# Patient Record
Sex: Female | Born: 1945 | Race: Black or African American | Hispanic: No | Marital: Married | Smoking: Never smoker
Health system: Southern US, Community
[De-identification: ages and names within clinical notes are randomized; demographics above are authoritative.]

## PROBLEM LIST (undated history)

## (undated) DIAGNOSIS — Z9289 Personal history of other medical treatment: Secondary | ICD-10-CM

## (undated) DIAGNOSIS — C50919 Malignant neoplasm of unspecified site of unspecified female breast: Secondary | ICD-10-CM

## (undated) DIAGNOSIS — R0683 Snoring: Secondary | ICD-10-CM

## (undated) DIAGNOSIS — Z853 Personal history of malignant neoplasm of breast: Secondary | ICD-10-CM

## (undated) DIAGNOSIS — Z124 Encounter for screening for malignant neoplasm of cervix: Secondary | ICD-10-CM

## (undated) DIAGNOSIS — IMO0001 Reserved for inherently not codable concepts without codable children: Secondary | ICD-10-CM

## (undated) DIAGNOSIS — J45909 Unspecified asthma, uncomplicated: Secondary | ICD-10-CM

## (undated) DIAGNOSIS — Z9889 Other specified postprocedural states: Secondary | ICD-10-CM

## (undated) DIAGNOSIS — J302 Other seasonal allergic rhinitis: Secondary | ICD-10-CM

## (undated) DIAGNOSIS — IMO0002 Reserved for concepts with insufficient information to code with codable children: Secondary | ICD-10-CM

## (undated) HISTORY — DX: Unspecified asthma, uncomplicated: J45.909

## (undated) HISTORY — DX: Other seasonal allergic rhinitis: J30.2

## (undated) HISTORY — DX: Other specified postprocedural states: Z98.890

## (undated) HISTORY — PX: EYE SURGERY: SHX253

## (undated) HISTORY — DX: Encounter for screening for malignant neoplasm of cervix: Z12.4

## (undated) HISTORY — DX: Personal history of other medical treatment: Z92.89

## (undated) HISTORY — DX: Personal history of malignant neoplasm of breast: Z85.3

---
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
## 2008-04-10 DIAGNOSIS — Z124 Encounter for screening for malignant neoplasm of cervix: Secondary | ICD-10-CM

## 2008-04-10 DIAGNOSIS — Z9289 Personal history of other medical treatment: Secondary | ICD-10-CM

## 2008-04-10 HISTORY — DX: Personal history of other medical treatment: Z92.89

## 2008-04-10 HISTORY — DX: Encounter for screening for malignant neoplasm of cervix: Z12.4

## 2008-10-09 ENCOUNTER — Other Ambulatory Visit: Admission: RE | Admit: 2008-10-09 | Discharge: 2008-10-09 | Payer: Self-pay | Admitting: Family Medicine

## 2008-10-29 ENCOUNTER — Encounter: Admission: RE | Admit: 2008-10-29 | Discharge: 2008-10-29 | Payer: Self-pay | Admitting: Radiology

## 2008-11-04 ENCOUNTER — Encounter: Admission: RE | Admit: 2008-11-04 | Discharge: 2008-11-04 | Payer: Self-pay | Admitting: Surgery

## 2008-11-06 ENCOUNTER — Encounter (INDEPENDENT_AMBULATORY_CARE_PROVIDER_SITE_OTHER): Payer: Self-pay | Admitting: Surgery

## 2008-11-06 ENCOUNTER — Ambulatory Visit (HOSPITAL_BASED_OUTPATIENT_CLINIC_OR_DEPARTMENT_OTHER): Admission: RE | Admit: 2008-11-06 | Discharge: 2008-11-06 | Payer: Self-pay | Admitting: Surgery

## 2008-11-06 HISTORY — PX: BREAST LUMPECTOMY: SHX2

## 2008-11-11 ENCOUNTER — Ambulatory Visit: Payer: Self-pay | Admitting: Oncology

## 2008-11-18 LAB — CBC WITH DIFFERENTIAL/PLATELET
BASO%: 0.7 % (ref 0.0–2.0)
Basophils Absolute: 0.1 10*3/uL (ref 0.0–0.1)
EOS%: 3.2 % (ref 0.0–7.0)
HGB: 13.6 g/dL (ref 11.6–15.9)
MCH: 30.9 pg (ref 25.1–34.0)
MCHC: 33.7 g/dL (ref 31.5–36.0)
MONO%: 7.6 % (ref 0.0–14.0)
RBC: 4.41 10*6/uL (ref 3.70–5.45)
RDW: 14.3 % (ref 11.2–14.5)
lymph#: 1.7 10*3/uL (ref 0.9–3.3)

## 2008-11-19 LAB — COMPREHENSIVE METABOLIC PANEL
ALT: 15 U/L (ref 0–35)
AST: 17 U/L (ref 0–37)
Albumin: 3.7 g/dL (ref 3.5–5.2)
Alkaline Phosphatase: 79 U/L (ref 39–117)
Calcium: 9.2 mg/dL (ref 8.4–10.5)
Chloride: 107 mEq/L (ref 96–112)
Potassium: 4.3 mEq/L (ref 3.5–5.3)
Sodium: 143 mEq/L (ref 135–145)
Total Protein: 7 g/dL (ref 6.0–8.3)

## 2008-11-23 ENCOUNTER — Encounter: Admission: RE | Admit: 2008-11-23 | Discharge: 2008-11-23 | Payer: Self-pay | Admitting: Oncology

## 2008-12-02 ENCOUNTER — Ambulatory Visit (HOSPITAL_COMMUNITY): Admission: RE | Admit: 2008-12-02 | Discharge: 2008-12-02 | Payer: Self-pay | Admitting: Oncology

## 2008-12-17 ENCOUNTER — Ambulatory Visit: Payer: Self-pay | Admitting: Oncology

## 2009-01-08 ENCOUNTER — Ambulatory Visit: Admission: RE | Admit: 2009-01-08 | Discharge: 2009-03-25 | Payer: Self-pay | Admitting: Radiation Oncology

## 2009-03-02 ENCOUNTER — Ambulatory Visit: Payer: Self-pay | Admitting: Oncology

## 2009-04-07 ENCOUNTER — Ambulatory Visit: Payer: Self-pay | Admitting: Oncology

## 2009-06-22 ENCOUNTER — Ambulatory Visit: Payer: Self-pay | Admitting: Oncology

## 2009-07-16 LAB — CBC WITH DIFFERENTIAL/PLATELET
Eosinophils Absolute: 0.2 10*3/uL (ref 0.0–0.5)
MONO#: 0.6 10*3/uL (ref 0.1–0.9)
NEUT#: 3.7 10*3/uL (ref 1.5–6.5)
Platelets: 215 10*3/uL (ref 145–400)
RBC: 4.23 10*6/uL (ref 3.70–5.45)
RDW: 13.9 % (ref 11.2–14.5)
WBC: 6.5 10*3/uL (ref 3.9–10.3)

## 2009-07-22 ENCOUNTER — Ambulatory Visit: Admission: RE | Admit: 2009-07-22 | Discharge: 2009-07-27 | Payer: Self-pay | Admitting: Radiation Oncology

## 2009-10-19 ENCOUNTER — Ambulatory Visit: Payer: Self-pay | Admitting: Oncology

## 2009-10-19 LAB — CBC WITH DIFFERENTIAL/PLATELET
Basophils Absolute: 0 10*3/uL (ref 0.0–0.1)
Eosinophils Absolute: 0.2 10*3/uL (ref 0.0–0.5)
HCT: 38.3 % (ref 34.8–46.6)
HGB: 13.1 g/dL (ref 11.6–15.9)
MCV: 92.3 fL (ref 79.5–101.0)
MONO%: 8.2 % (ref 0.0–14.0)
NEUT#: 3.8 10*3/uL (ref 1.5–6.5)
NEUT%: 63.1 % (ref 38.4–76.8)
Platelets: 223 10*3/uL (ref 145–400)
RDW: 14.4 % (ref 11.2–14.5)

## 2009-10-20 LAB — COMPREHENSIVE METABOLIC PANEL
Albumin: 3.7 g/dL (ref 3.5–5.2)
Alkaline Phosphatase: 72 U/L (ref 39–117)
BUN: 13 mg/dL (ref 6–23)
Calcium: 9.2 mg/dL (ref 8.4–10.5)
Glucose, Bld: 115 mg/dL — ABNORMAL HIGH (ref 70–99)
Potassium: 4.3 mEq/L (ref 3.5–5.3)

## 2010-03-17 IMAGING — CR DG CHEST 2V
2 series · 2 of 2 positions shown · non-contrast
Comparison: None

CLINICAL DATA: Preoperative respiratory exam.  Breast cancer.

CHEST - 2 VIEW

[w chest pa]
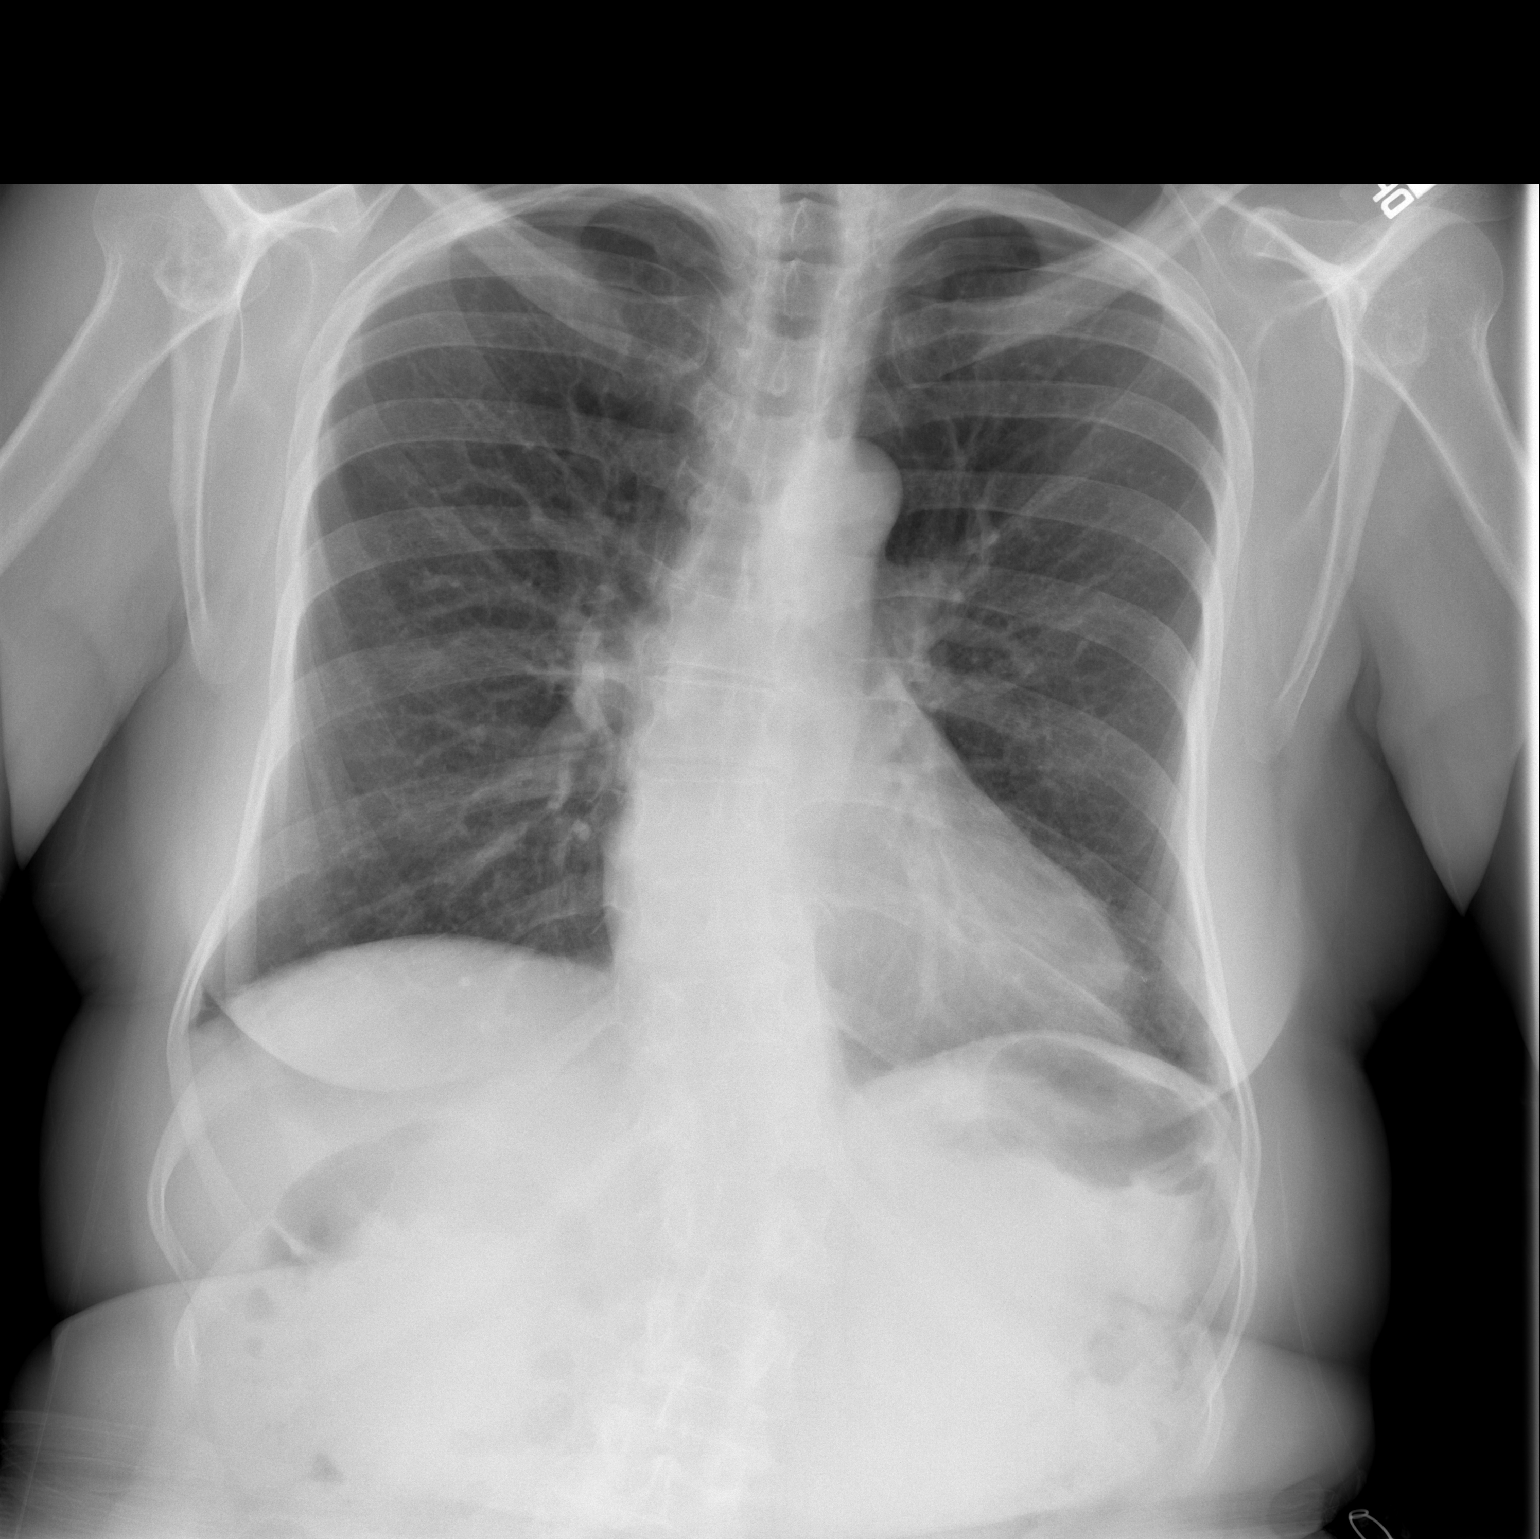

[w chest lat]
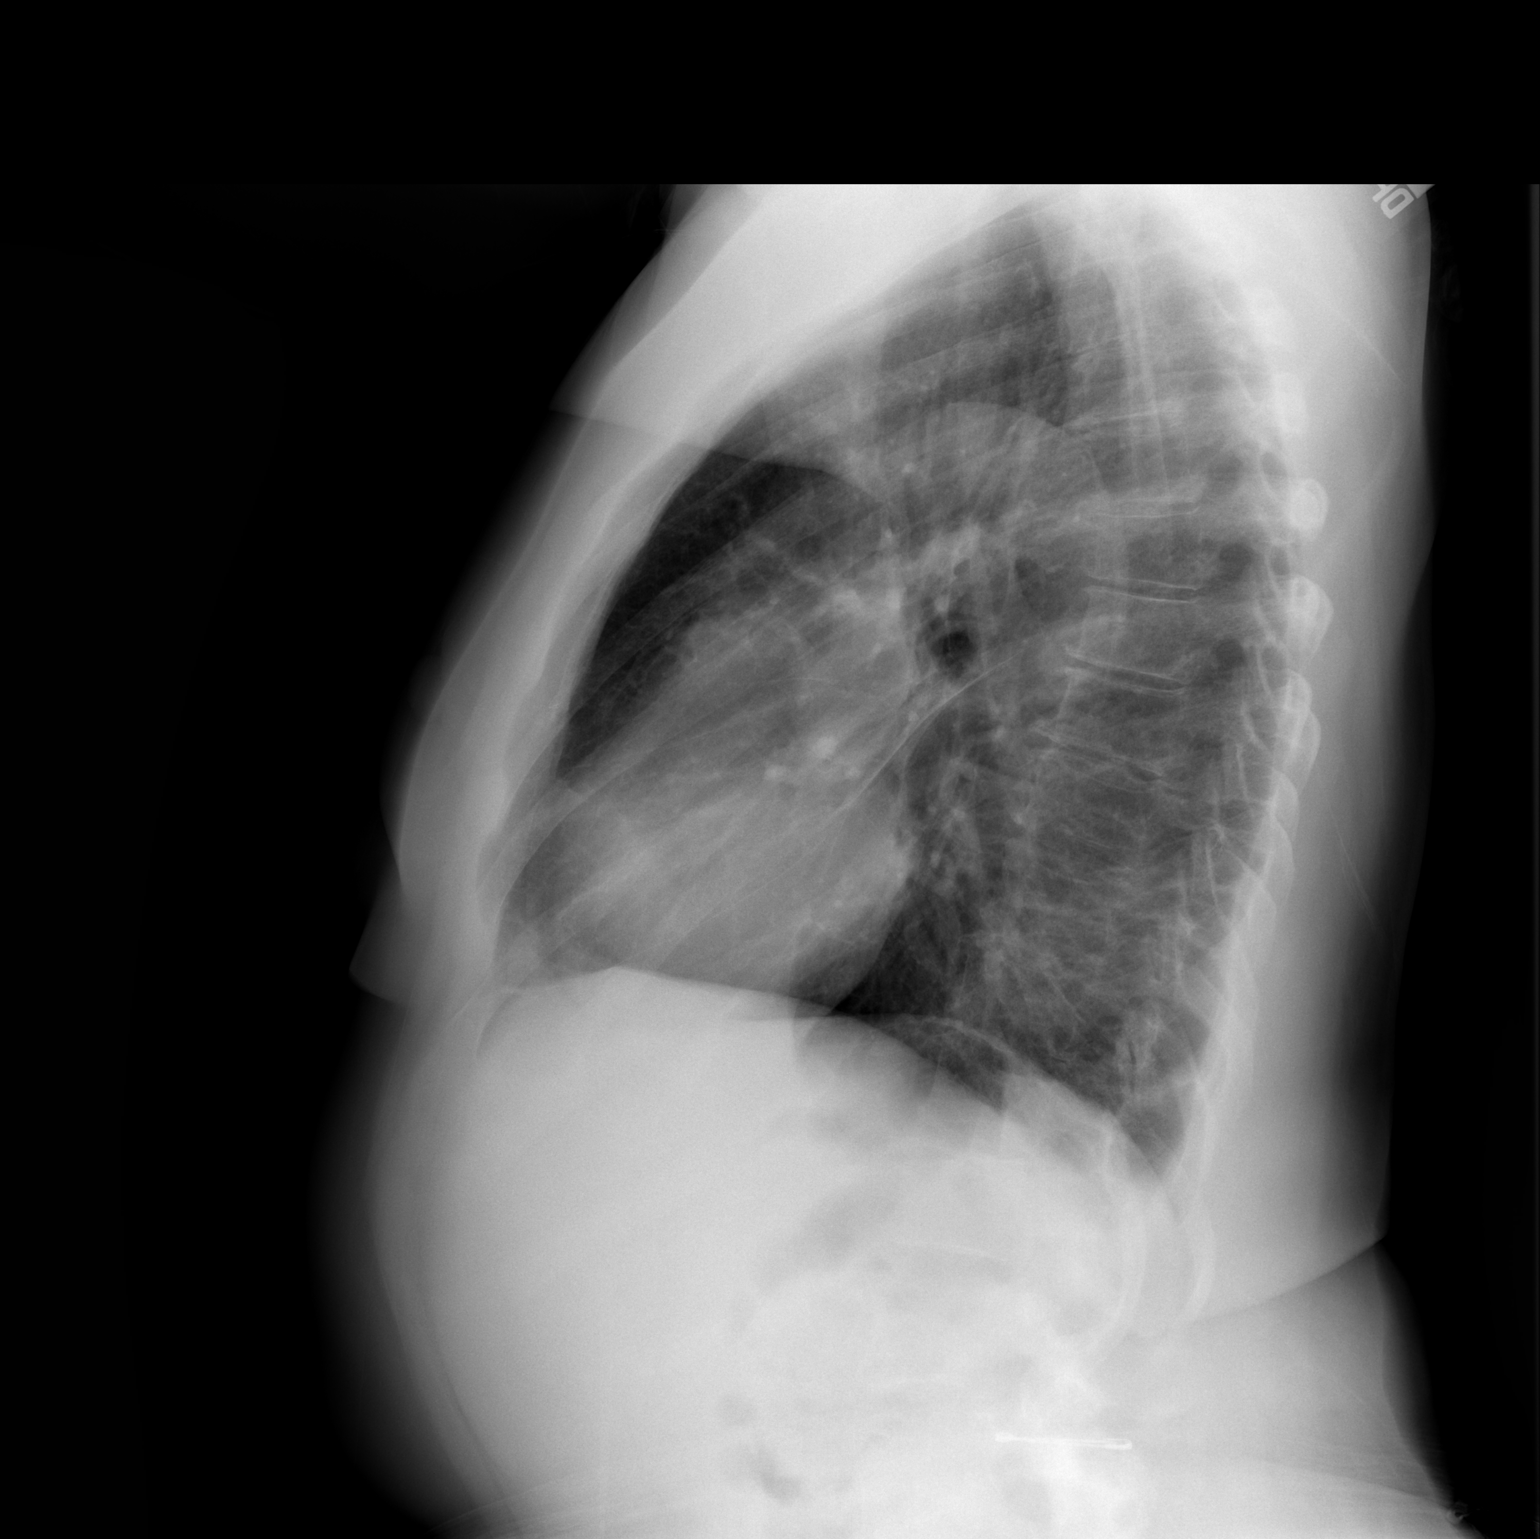

[2 of 2 positions shown; findings below may reference images not displayed]

FINDINGS: Heart size is normal.  Mediastinum is unremarkable except
for scoliosis.  The lungs are clear.  No effusions.  No acute bony
finding.
IMPRESSION: No active disease.  Scoliosis.

## 2010-07-17 LAB — CBC
HCT: 40.6 % (ref 36.0–46.0)
Hemoglobin: 13.8 g/dL (ref 12.0–15.0)
WBC: 7.2 10*3/uL (ref 4.0–10.5)

## 2010-07-17 LAB — URINALYSIS, ROUTINE W REFLEX MICROSCOPIC
Glucose, UA: NEGATIVE mg/dL
Specific Gravity, Urine: 1.009 (ref 1.005–1.030)
pH: 6 (ref 5.0–8.0)

## 2010-07-17 LAB — COMPREHENSIVE METABOLIC PANEL
ALT: 20 U/L (ref 0–35)
Albumin: 3.3 g/dL — ABNORMAL LOW (ref 3.5–5.2)
Alkaline Phosphatase: 77 U/L (ref 39–117)
BUN: 23 mg/dL (ref 6–23)
Chloride: 107 mEq/L (ref 96–112)
Glucose, Bld: 85 mg/dL (ref 70–99)
Potassium: 3.9 mEq/L (ref 3.5–5.1)
Total Bilirubin: 0.8 mg/dL (ref 0.3–1.2)

## 2010-07-17 LAB — DIFFERENTIAL
Basophils Absolute: 0 10*3/uL (ref 0.0–0.1)
Basophils Relative: 1 % (ref 0–1)
Eosinophils Absolute: 0.2 10*3/uL (ref 0.0–0.7)
Monocytes Absolute: 0.5 10*3/uL (ref 0.1–1.0)
Neutro Abs: 4.9 10*3/uL (ref 1.7–7.7)
Neutrophils Relative %: 67 % (ref 43–77)

## 2010-08-23 NOTE — Op Note (Signed)
Ana Woodward, Ana Woodward               ACCOUNT NO.:  [id number]   MEDICAL RECORD NO.:  [id number]          PATIENT TYPE:  AMB   LOCATION:  DSC                          FACILITY:  [facility]   PHYSICIAN:  Currie Paris, M.D.DATE OF BIRTH:  May 31, 1945   DATE OF PROCEDURE:  11/06/2008  DATE OF DISCHARGE:                               OPERATIVE REPORT   PREOPERATIVE DIAGNOSIS:  Carcinoma, left breast upper outer quadrant,  clinical stage II.   POSTOPERATIVE DIAGNOSIS:  Carcinoma, left breast upper outer quadrant,  clinical stage II.   PROCEDURE:  Left partial mastectomy with blue dye injection and sentinel  lymph node biopsy (one node).   SURGEON:  Currie Paris, M.D.   ANESTHESIA:  General.   CLINICAL HISTORY:  This is a 65 year old lady who was recently diagnosed  with a left breast cancer.  She had initially palpated that and after  workup, it was recommended to have a partial mastectomy.  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  We discussed the possibility of needing to take the subareolar  area out as part of her lumpectomy.  We discussed that we might be able  to get a satisfactory frozen exam of that area.  In addition, we planned  a sentinel node.   DESCRIPTION OF PROCEDURE:  The patient was seen in the holding area and  she had no further questions.  We confirmed the plans as noted above.  She had already been injected in the left breast with her radioactive  isotope.   The patient was taken to the operating room and after satisfactory  general anesthesia had been obtained, the time-out was done.  I then  injected the left nipple areolar area with about 5 mL of dilute  methylene blue and this was massaged in.   Routine prep and drape was then done.  I used a Neoprobe and identified  a hot area in the axilla, made a short incision and found  almost  immediately a blue lymphatic leading to a bright blue lymph node that  was saturated with methylene blue.  This was removed and had counts of  about 5000.   Once this was out, I looked for other lymph nodes and could not palpate  any.  I did not see any other blue lymph nodes and I saw no counts of  any significance in the axilla.  I placed a small moist pack here.   I then turned my attention to the breast.  The mass was just above the 3  o'clock position, perhaps about the 2:30 position and just adjacent to  the areolar margin.  I made a curvilinear incision around the areola and  then extended it in a radial fashion directly over the mass.  I raised  some skin flaps superiorly, a little bit laterally, inferiorly and then  medially going beyond the nipple and staying right under the nipple so,  that I got all of the ductal tissue right up  to the base of the nipple  as part of the superficial margin.   Then on the inferior aspect, I went down to the chest wall and then came  around laterally at the level of chest wall dividing the breast tissue  down to the chest wall, then superiorly.  When it was freed up on these  three sides, I was able to elevate it and free it up off the muscle  taking the fascia.   Once this was done, I was able to put traction on the whole area and  divided it medially, basically medial to the nipple.   At this point, I stopped and put some local in to help with postop  analgesia and made sure everything was dry using cautery or suture  ligatures.  I then inked the specimen.  I did do a specimen mammogram to  see that the clip, what appeared to be all the calcifications were in  the specimen, or at least there were no calcifications that appeared to  be near any of the four margins that I could evaluate on the specimen  mammogram.   This was sent and I asked Dr. Colonel Bald to try to do a frozen of the area of  the methylene blue where I put a suture  marker to see if that area was  negative.   Dr. Colonel Bald also reported at this point that the sentinel lymph node was  negative.   That incision was then closed with a 3-0 Vicryl and some 4-Monocryl  subcuticular and at the end of the case Dermabond.   While awaiting for the frozen section of the subareolar tissue, I freed  up the breast off of the chest wall for the length of the lumpectomy  both, superiorly and inferiorly and then approximated the deeper breast  tissue to cover the muscle.  There really was not much more  superficially to close other than the subcu which was closed with some  more 3-0 Vicryl, producing just a little bit of a elevation of the skin  line and the skin was closed from the areolar margin laterally with a  running 4-0 Monocryl subcuticular.   At this point, Dr. Colonel Bald called to say that the tissue was negative in  terms of any cancer at least visible on the frozen section at that one  margin that I was concerned with so, I went ahead and then closed the  curvilinear circumareolar portion of the incision with some 4-0 Monocryl  subcuticular and finally Dermabond.   The patient tolerated the procedure well and there were no  complications.  All counts were correct.      Currie Paris, M.D.  Electronically Signed     CJS/MEDQ  D:  11/06/2008  T:  11/07/2008  Job:  [id number]   cc:   Sigmund Hazel, M.D.

## 2010-12-21 ENCOUNTER — Encounter (INDEPENDENT_AMBULATORY_CARE_PROVIDER_SITE_OTHER): Payer: Self-pay | Admitting: Surgery

## 2011-06-08 ENCOUNTER — Encounter (INDEPENDENT_AMBULATORY_CARE_PROVIDER_SITE_OTHER): Payer: Self-pay | Admitting: General Surgery

## 2011-06-08 DIAGNOSIS — C50412 Malignant neoplasm of upper-outer quadrant of left female breast: Secondary | ICD-10-CM | POA: Insufficient documentation

## 2011-06-09 ENCOUNTER — Encounter (INDEPENDENT_AMBULATORY_CARE_PROVIDER_SITE_OTHER): Payer: Self-pay | Admitting: Surgery

## 2011-06-27 ENCOUNTER — Encounter (INDEPENDENT_AMBULATORY_CARE_PROVIDER_SITE_OTHER): Payer: Self-pay | Admitting: Surgery

## 2011-07-26 ENCOUNTER — Encounter (INDEPENDENT_AMBULATORY_CARE_PROVIDER_SITE_OTHER): Payer: Self-pay | Admitting: Surgery

## 2011-07-26 ENCOUNTER — Telehealth (INDEPENDENT_AMBULATORY_CARE_PROVIDER_SITE_OTHER): Payer: Self-pay | Admitting: Surgery

## 2011-07-26 NOTE — Telephone Encounter (Signed)
Spoke with patient, aware we will not be able to schedule her to come in prior to 09/01/11 due to his limited schedule in May. She understands and will follow up at appt.

## 2011-08-28 ENCOUNTER — Encounter (INDEPENDENT_AMBULATORY_CARE_PROVIDER_SITE_OTHER): Payer: Self-pay

## 2011-09-01 ENCOUNTER — Encounter (INDEPENDENT_AMBULATORY_CARE_PROVIDER_SITE_OTHER): Payer: Self-pay | Admitting: Surgery

## 2011-09-06 ENCOUNTER — Encounter (INDEPENDENT_AMBULATORY_CARE_PROVIDER_SITE_OTHER): Payer: Self-pay | Admitting: Surgery

## 2011-10-18 ENCOUNTER — Encounter (INDEPENDENT_AMBULATORY_CARE_PROVIDER_SITE_OTHER): Payer: Self-pay | Admitting: Ophthalmology

## 2011-10-27 ENCOUNTER — Encounter (INDEPENDENT_AMBULATORY_CARE_PROVIDER_SITE_OTHER): Payer: BC Managed Care – PPO | Admitting: Ophthalmology

## 2011-10-27 DIAGNOSIS — H33309 Unspecified retinal break, unspecified eye: Secondary | ICD-10-CM

## 2011-10-27 DIAGNOSIS — H35419 Lattice degeneration of retina, unspecified eye: Secondary | ICD-10-CM

## 2011-10-27 DIAGNOSIS — H43819 Vitreous degeneration, unspecified eye: Secondary | ICD-10-CM

## 2011-11-08 ENCOUNTER — Ambulatory Visit (INDEPENDENT_AMBULATORY_CARE_PROVIDER_SITE_OTHER): Payer: BC Managed Care – PPO | Admitting: Ophthalmology

## 2011-11-08 DIAGNOSIS — H33309 Unspecified retinal break, unspecified eye: Secondary | ICD-10-CM

## 2011-11-14 ENCOUNTER — Encounter (INDEPENDENT_AMBULATORY_CARE_PROVIDER_SITE_OTHER): Payer: Self-pay | Admitting: Surgery

## 2011-11-14 ENCOUNTER — Ambulatory Visit (INDEPENDENT_AMBULATORY_CARE_PROVIDER_SITE_OTHER): Payer: BC Managed Care – PPO | Admitting: Surgery

## 2011-11-14 VITALS — BP 116/78 | HR 84 | Resp 20 | Ht 71.0 in | Wt 257.0 lb

## 2011-11-14 DIAGNOSIS — Z853 Personal history of malignant neoplasm of breast: Secondary | ICD-10-CM

## 2011-11-14 NOTE — Patient Instructions (Addendum)
Continue annual mammograms and annual followup

## 2011-11-14 NOTE — Progress Notes (Signed)
NAME: Ana Woodward       DOB: Feb 23, 1946           DATE: 11/14/2011       MRN: [id number]   Ana Woodward is a 65 y.o.Marland Kitchenfemale who presents for routine followup of her Left breast cancer, invasive ductal, stage I diagnosed in 2010 and treated with Lumpectomy and sentinel node followed by radiation therapy. She is not on antiestrogen therapy. She has no problems or concerns on either side Other than some intermittent discoloration of the right nipple. She did assist the last time I saw her which was in August of 2011. There's been no change.  PFSH: She has had no significant changes since the last visit here.  ROS: There have been no significant changes since the last visit here  EXAM:  VS: BP 116/78  Pulse 84  Resp 20  Ht 5\' 11"  (1.803 m)  Wt 257 lb (116.574 kg)  BMI 35.84 kg/m2   General: The patient is alert, oriented, generally healthy appearing, NAD. Mood and affect are normal.  Breasts:  The left breast seems a little bit smaller than the right. However there is no abnormality on the right side that I can find in the left breast feels normal with no significant thickening at the lumpectomy site and no tenderness. Minimal to no visible radiation changes.  Lymphatics: She has no axillary or supraclavicular adenopathy on either side.  Extremities: Full ROM of the surgical side with no lymphedema noted.  Data Reviewed: Her mammogram this past March was negative  Impression: Doing well, with no evidence of recurrent cancer or new cancer  Plan: Will continue to follow up on an annual basis here.

## 2011-11-17 ENCOUNTER — Ambulatory Visit (INDEPENDENT_AMBULATORY_CARE_PROVIDER_SITE_OTHER): Payer: BC Managed Care – PPO | Admitting: Ophthalmology

## 2011-11-17 DIAGNOSIS — H33309 Unspecified retinal break, unspecified eye: Secondary | ICD-10-CM

## 2012-03-15 ENCOUNTER — Telehealth (INDEPENDENT_AMBULATORY_CARE_PROVIDER_SITE_OTHER): Payer: Self-pay | Admitting: General Surgery

## 2012-03-15 NOTE — Telephone Encounter (Signed)
Message copied by Liliana Cline on Fri Mar 15, 2012 10:14 AM ------      Message from: Zollie Scale      Created: Thu Mar 14, 2012  4:51 PM      Contact: [phone]       Pt found spot on arm waned a soon appt  Could not find any appt

## 2012-03-15 NOTE — Telephone Encounter (Signed)
Called patient to make appt. No answer on both numbers provided and no way to leave message. If patient calls back she was last seen in August 2013 and can be seen in a follow up spot if needed.

## 2012-03-18 ENCOUNTER — Ambulatory Visit (INDEPENDENT_AMBULATORY_CARE_PROVIDER_SITE_OTHER): Payer: BC Managed Care – PPO | Admitting: Ophthalmology

## 2012-03-28 ENCOUNTER — Ambulatory Visit (INDEPENDENT_AMBULATORY_CARE_PROVIDER_SITE_OTHER): Payer: Self-pay | Admitting: Ophthalmology

## 2013-04-23 ENCOUNTER — Ambulatory Visit: Payer: BC Managed Care – PPO

## 2013-04-23 ENCOUNTER — Ambulatory Visit (INDEPENDENT_AMBULATORY_CARE_PROVIDER_SITE_OTHER): Payer: BC Managed Care – PPO

## 2013-04-23 VITALS — BP 126/76 | HR 67 | Resp 18

## 2013-04-23 DIAGNOSIS — M79609 Pain in unspecified limb: Secondary | ICD-10-CM

## 2013-04-23 DIAGNOSIS — S90129A Contusion of unspecified lesser toe(s) without damage to nail, initial encounter: Secondary | ICD-10-CM

## 2013-04-23 DIAGNOSIS — Q828 Other specified congenital malformations of skin: Secondary | ICD-10-CM

## 2013-04-23 DIAGNOSIS — B351 Tinea unguium: Secondary | ICD-10-CM

## 2013-04-23 MED ORDER — TAVABOROLE 5 % EX SOLN: CUTANEOUS | Status: DC

## 2013-04-23 NOTE — Patient Instructions (Signed)
Onychomycosis/Fungal Toenails  WHAT IS IT? An infection that lies within the keratin of your nail plate that is caused by a fungus.  WHY ME? Fungal infections affect all ages, sexes, races, and creeds.  There may be many factors that predispose you to a fungal infection such as age, coexisting medical conditions such as diabetes, or an autoimmune disease; stress, medications, fatigue, genetics, etc.  Bottom line: fungus thrives in a warm, moist environment and your shoes offer such a location.  IS IT CONTAGIOUS? Theoretically, yes.  You do not want to share shoes, nail clippers or files with someone who has fungal toenails.  Walking around barefoot in the same room or sleeping in the same bed is unlikely to transfer the organism.  It is important to realize, however, that fungus can spread easily from one nail to the next on the same foot.  HOW DO WE TREAT THIS?  There are several ways to treat this condition.  Treatment may depend on many factors such as age, medications, pregnancy, liver and kidney conditions, etc.  It is best to ask your doctor which options are available to you.  1. No treatment.   Unlike many other medical concerns, you can live with this condition.  However for many people this can be a painful condition and may lead to ingrown toenails or a bacterial infection.  It is recommended that you keep the nails cut short to help reduce the amount of fungal nail. 2. Topical treatment.  These range from herbal remedies to prescription strength nail lacquers.  About 40-50% effective, topicals require twice daily application for approximately 9 to 12 months or until an entirely new nail has grown out.  The most effective topicals are medical grade medications available through physicians offices. 3. Oral antifungal medications.  With an 80-90% cure rate, the most common oral medication requires 3 to 4 months of therapy and stays in your system for a year as the new nail grows out.  Oral  antifungal medications do require blood work to make sure it is a safe drug for you.  A liver function panel will be performed prior to starting the medication and after the first month of treatment.  It is important to have the blood work performed to avoid any harmful side effects.  In general, this medication safe but blood work is required. 4. Laser Therapy.  This treatment is performed by applying a specialized laser to the affected nail plate.  This therapy is noninvasive, fast, and non-painful.  It is not covered by insurance and is therefore, out of pocket.  The results have been very good with a 80-95% cure rate.  [facility] is the only practice in the area to offer this therapy. 5. Permanent Nail Avulsion.  Removing the entire nail so that a new nail will not grow back.  Want to obtain your prescription topical Kerydin. Applied to the affected nails once daily as instructed for 12 months duration. Maintain trimming and filing the nails on a monthly basis wash daily with soap and water and reapply the [facility] as instructed

## 2013-04-23 NOTE — Progress Notes (Signed)
   Subjective:    Patient ID: Ana Woodward, female    DOB: [birth year], 68 y.o.   MRN: [id number]  HPImy 3rd toe on left foot has been going since the summer and stumbed it twice and burns and throbs and cant walk on it and my have a callus on the tip of it    Review of Systems  Constitutional: Negative.   HENT:       Sneezing   Eyes: Negative.   Respiratory: Negative.   Cardiovascular: Negative.   Endocrine: Positive for cold intolerance.  Genitourinary: Negative.   Musculoskeletal:       Difficulty walking   Skin: Positive for rash.       Open sores and toenails are fungus       Objective:   Physical Exam Masker status is intact as follows DP +2/4 bilateral PT plus one over 4 bilateral. Capillary fill time 3 seconds all digits skin temperature warm turgor normal mild varicosities noted no edema rubor pallor noted. Neurologically epicritic and proprioceptive sensations intact and symmetric bilateral there is normal plantar response DTRs not elicited. Dermatologically skin color pigment normal hair growth absent nails thick brittle discolored friable with arthrosis and incurvation one through 5 bilateral. Patient has been doing self nail care however is having difficult time to the thickness and discoloration. His interest in fungal nail care topicals at this time. Patient also third digit bilateral left more so than right has a hemorrhage a keratotic lesion consistent with porokeratosis or distal clavus. Examination reveals patient has some digital contractures and dislocation of second MTP area left foot has undergone previous hammertoe repair with IP joint fusion. At this time the patient does have keratoses distal tuft third and exam and evaluation were shoes were shoes are 2 short and slip on shoe which is sliding back-and-forth on your foot with toes bumping up into the end of her shoe. This is correlated to the keratotic lesions secondary to trauma and friction. The painful  keratotic lesion is debrided suggested longer shoes or larger shoe and lace up or strapping systems avoid canning of the shoe x-rays otherwise unremarkable mild bunion deformity bilateral sesamoid position 3 hammertoe deformities with flexibility noted dislocated second MTP on AP and oblique view. Mild degenerative changes and asymmetric joint space narrowing second MTP joint. Contractures of toes 34 and 5 noted bilateral.       Assessment & Plan:  Assessments #1 onychomycosis mycotic nails debrided per patient request is an uncovered service at this time x10. Patient is also dispensed prescription for Kwrtdin topical antifungal to apply daily for 12 months duration.  Patient does have poor keratoses secondary to contusion of toe and hammertoe deformity keratotic lesions. Debridement this time followup in the future with longer more accommodative shoes reappointed future and as-needed basis for further followup and palliative care in the future as needed again ABN form is signed is a noncovered service for debridement of nails and keratoses.  Harriet Masson DPM

## 2013-07-08 ENCOUNTER — Ambulatory Visit: Payer: BC Managed Care – PPO

## 2013-07-28 ENCOUNTER — Other Ambulatory Visit (INDEPENDENT_AMBULATORY_CARE_PROVIDER_SITE_OTHER): Payer: Self-pay | Admitting: Surgery

## 2013-07-28 ENCOUNTER — Telehealth (INDEPENDENT_AMBULATORY_CARE_PROVIDER_SITE_OTHER): Payer: Self-pay | Admitting: *Deleted

## 2013-07-28 DIAGNOSIS — N63 Unspecified lump in unspecified breast: Secondary | ICD-10-CM

## 2013-07-28 NOTE — Telephone Encounter (Signed)
Pt called and she is scheduled for a 3D Diagnostic Mammo and Ultra Sound at [facility] 4.20.15 and needs orders to be put into Epic so they can forward the results to Dr. Ninfa Linden.  Please advise!  Anderson Malta

## 2013-07-28 NOTE — Telephone Encounter (Signed)
This is a Dr. Margot Chimes patient that I have never seen. I don't know who orders her mammagrams yearly, but send to nurses and they can order

## 2013-08-04 ENCOUNTER — Telehealth (INDEPENDENT_AMBULATORY_CARE_PROVIDER_SITE_OTHER): Payer: Self-pay | Admitting: *Deleted

## 2013-08-04 NOTE — Telephone Encounter (Signed)
Pt left a message stating she is due to have annual 3D Mammogram and US done of breast so Dr. Rush Farmer could get the results and she also needs an appt to see.  Per Dr. Rush Farmer, pt is old pt of Dr. Dickie La and he hasn't yet saw the pt.  He asked me to forward this so the test could get ordered and scheduled and then pt needs to be called for her annual f/u.  The reason I sent to Dr. Rush Farmer is b/c that's who the pt said, so I'm assuming maybe that's who Dr. Margot Chimes told her to schedule to see?    Please advise!  Anderson Malta

## 2013-08-04 NOTE — Telephone Encounter (Signed)
Called patient back to let her know that we will do the mammogram and u/s when she comes in to see Dr Ninfa Linden on 08-13-13. And patient agree to that

## 2013-08-13 ENCOUNTER — Ambulatory Visit (INDEPENDENT_AMBULATORY_CARE_PROVIDER_SITE_OTHER): Payer: BC Managed Care – PPO | Admitting: Surgery

## 2013-08-18 ENCOUNTER — Encounter (INDEPENDENT_AMBULATORY_CARE_PROVIDER_SITE_OTHER): Payer: Self-pay | Admitting: Surgery

## 2013-08-18 ENCOUNTER — Ambulatory Visit (INDEPENDENT_AMBULATORY_CARE_PROVIDER_SITE_OTHER): Payer: Commercial Managed Care - HMO | Admitting: Surgery

## 2013-08-18 VITALS — BP 120/84 | HR 78 | Temp 97.1°F | Resp 12 | Ht 71.0 in | Wt 228.0 lb

## 2013-08-18 DIAGNOSIS — Z853 Personal history of malignant neoplasm of breast: Secondary | ICD-10-CM

## 2013-08-18 NOTE — Progress Notes (Signed)
Subjective:     Patient ID: Ana Woodward, female   DOB: [birth year], 68 y.o.   MRN: [id number]  HPI This is a breast cancer patient of Dr. Dickie La.  She is status post a left breast lumpectomy and sentinel node biopsy for stage I invasive cancer. She received radiation therapy. She did not receive anti-hormonal therapy.  She last saw Dr. Margot Chimes in 2013. She just recently had repeat mammograms.  She has no complaints regarding her breast.  Per her report, her mammograms from 2 weeks ago were normal. She received a letter stating such  Review of Systems     Objective:   Physical Exam On exam, there is no axillary adenopathy on either side. There are no palpable breast masses. Lumpectomy site is well-healed.    Assessment:     Patient now 5 years status post treatment for left breast cancer     Plan:     From a surgical standpoint, there is nothing further to offer. She needs her yearly physical examination by her primary care physician and yearly mammograms. We will see her here as needed. We will get a copy of the report of her most recent mammograms

## 2013-10-06 ENCOUNTER — Encounter (INDEPENDENT_AMBULATORY_CARE_PROVIDER_SITE_OTHER): Payer: Commercial Managed Care - HMO | Admitting: Surgery

## 2013-10-14 ENCOUNTER — Encounter (INDEPENDENT_AMBULATORY_CARE_PROVIDER_SITE_OTHER): Payer: Self-pay | Admitting: Surgery

## 2013-10-17 ENCOUNTER — Telehealth: Payer: Self-pay | Admitting: *Deleted

## 2013-10-17 NOTE — Telephone Encounter (Signed)
Can get over-the-counter Fungi-Nail at any drugstore pharmacy, although not as potent as the new prescription medications. It is inexpensive and can be obtained without prescription. Need to applied twice daily to the affected toenails for a minimum of 12 month  Harriet Masson DPM

## 2013-10-17 NOTE — Telephone Encounter (Signed)
Pt called and said because of her age she can not fill keraydin any longer. Age limit is 82 and she is 89. Also she mentioned cost is high. Wants to know if she can have something else?

## 2013-10-20 ENCOUNTER — Telehealth: Payer: Self-pay | Admitting: *Deleted

## 2013-10-20 NOTE — Telephone Encounter (Signed)
I returned her call and informed her Dr. Blenda Mounts said you can try Fungi-Nail over the counter, apply it twice a day for 12 months.  He stated that it may not be quite as effective as the prescription medication.  She stated okay, I hate that because the other seemed to be working well.  She stated I'll see how it goes, at least I know the other option is there.

## 2013-11-03 ENCOUNTER — Encounter (INDEPENDENT_AMBULATORY_CARE_PROVIDER_SITE_OTHER): Payer: Self-pay | Admitting: Surgery

## 2013-11-03 ENCOUNTER — Ambulatory Visit (INDEPENDENT_AMBULATORY_CARE_PROVIDER_SITE_OTHER): Payer: Commercial Managed Care - HMO | Admitting: Surgery

## 2013-11-03 VITALS — BP 125/77 | HR 77 | Temp 98.1°F | Resp 18 | Ht 70.0 in | Wt 231.2 lb

## 2013-11-03 DIAGNOSIS — R229 Localized swelling, mass and lump, unspecified: Secondary | ICD-10-CM

## 2013-11-03 DIAGNOSIS — R222 Localized swelling, mass and lump, trunk: Secondary | ICD-10-CM

## 2013-11-03 DIAGNOSIS — L720 Epidermal cyst: Secondary | ICD-10-CM

## 2013-11-03 DIAGNOSIS — L723 Sebaceous cyst: Secondary | ICD-10-CM

## 2013-11-03 NOTE — Patient Instructions (Signed)
Our schedulers will call you back with setting up surgery

## 2013-11-03 NOTE — Progress Notes (Signed)
Subjective:     Patient ID: Ana Woodward, female   DOB: [birth year], 68 y.o.   MRN: [id number]  HPI This is a patient of ours who we follow for breast cancer. She has had an intermittent draining area on the back of her neck as well as an enlarging area on her left upper back. She treated the area on her neck with peroxide and Neosporin and is improved somewhat but not completely closed. The mass on her back she reports intermittently changes in size and occasionally gets red and tender  Review of Systems     Objective:   Physical Exam On exam, she has what appears to be an infected sebaceous cyst on the back of her neck which is a 7 mm in size. She also has a 5 cm mass on her left upper back which is consistent with either a lipoma or another large sebaceous cyst    Assessment:     7 mm posterior neck mass and 5 cm left upper back mass     Plan:     Because of her symptoms and drainage, removal of both these areas in the operating room is recommended to prevent further infection and for histologic evaluation to rule malignancy. Discussed this with her in detail. I discussed the risk of surgery which includes bleeding, infection, recurrence, et Ronney Asters. She understands and wishes to proceed with surgery.

## 2014-04-10 DIAGNOSIS — Z9889 Other specified postprocedural states: Secondary | ICD-10-CM

## 2014-04-10 HISTORY — DX: Other specified postprocedural states: Z98.890

## 2015-03-23 ENCOUNTER — Other Ambulatory Visit: Payer: Self-pay | Admitting: Radiology

## 2015-03-23 HISTORY — PX: BREAST BIOPSY: SHX20

## 2015-04-07 ENCOUNTER — Other Ambulatory Visit: Payer: Self-pay | Admitting: Surgery

## 2015-04-12 ENCOUNTER — Other Ambulatory Visit: Payer: Self-pay | Admitting: Oncology

## 2015-04-13 ENCOUNTER — Telehealth: Payer: Self-pay | Admitting: Hematology and Oncology

## 2015-04-13 ENCOUNTER — Telehealth: Payer: Self-pay | Admitting: *Deleted

## 2015-04-13 NOTE — Telephone Encounter (Signed)
Mailed new pt packet to pt.  

## 2015-04-13 NOTE — Telephone Encounter (Signed)
new patient appt-s/w patient and gave np appt for 01/10 @ 3:45 w/Dr. Lindi Adie Referring Dr. Ninfa Linden  Referral information scanned

## 2015-04-19 ENCOUNTER — Other Ambulatory Visit: Payer: Self-pay | Admitting: Oncology

## 2015-04-20 ENCOUNTER — Encounter: Payer: Self-pay | Admitting: *Deleted

## 2015-04-20 ENCOUNTER — Ambulatory Visit (HOSPITAL_BASED_OUTPATIENT_CLINIC_OR_DEPARTMENT_OTHER): Payer: Medicare Other | Admitting: Hematology and Oncology

## 2015-04-20 ENCOUNTER — Encounter: Payer: Self-pay | Admitting: Hematology and Oncology

## 2015-04-20 VITALS — BP 141/76 | HR 76 | Temp 98.1°F | Resp 20 | Wt 223.1 lb

## 2015-04-20 DIAGNOSIS — Z800 Family history of malignant neoplasm of digestive organs: Secondary | ICD-10-CM

## 2015-04-20 DIAGNOSIS — C50412 Malignant neoplasm of upper-outer quadrant of left female breast: Secondary | ICD-10-CM

## 2015-04-20 DIAGNOSIS — Z853 Personal history of malignant neoplasm of breast: Secondary | ICD-10-CM | POA: Diagnosis not present

## 2015-04-20 DIAGNOSIS — Z803 Family history of malignant neoplasm of breast: Secondary | ICD-10-CM

## 2015-04-20 NOTE — Progress Notes (Signed)
[facility] CONSULT NOTE  Patient Care Team: Kathyrn Lass, MD as PCP - General (Family Medicine)  CHIEF COMPLAINTS/PURPOSE OF CONSULTATION:  Newly diagnosed breast cancer  HISTORY OF PRESENTING ILLNESS:  Ana Woodward 70 y.o. female is here because of recent diagnosis of left breast cancer. She had a routine screening mammogram which revealed loosely grouped pleomorphic calcifications in the left breast. 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 She undergone radiation followed by surveillance. Recently she had a routine screening mammogram that identified the calcifications. 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 She had seen Dr. Ninfa Linden and is here today to discuss adjuvant treatment options. She was presented to the tumor board.  I reviewed her records extensively and collaborated the history with the patient.  SUMMARY OF ONCOLOGIC HISTORY:   Breast cancer of upper-outer quadrant of left female breast ([facility])   10/22/2008 Initial Diagnosis IDC with High-grade DCIS of the left breast ER/PR and HER-2 negative   11/06/2008 Surgery Left lumpectomy with sentinel node biopsy  invasive ductal carcinoma, grade 3, 0.5 cm. Margins negative. One of one lymph node negative ER/PR 0%, HER-2 negative ratio 0.92, Ki-67 17%, T1b N0 stage IA   03/15/2015 Mammogram new left breast loosely grouped pleomorphic calcifications measuring 3 cm , T2 N0 stage II a clinical stage   03/23/2015 Relapse/Recurrence Left breast biopsy: Invasive ductal carcinoma with DCIS with necrosis, grade 2, ER 100%, PR 0%, HER-2 positive ratio 3.34, Ki-67 80%   MEDICAL HISTORY:  Past Medical History  Diagnosis Date  . Asthma   . History of breast cancer   . Seasonal allergies     SURGICAL HISTORY: Past  Surgical History  Procedure Laterality Date  . Breast lumpectomy  11/06/2008    left with sentinel node biopsy  . Eye surgery  10/13/2011, 11/10/2011    right and left    SOCIAL HISTORY: Social History   Social History  . Marital Status: Married    Spouse Name: N/A  . Number of Children: N/A  . Years of Education: N/A   Occupational History  . Not on file.   Social History Main Topics  . Smoking status: Never Smoker   . Smokeless tobacco: Never Used  . Alcohol Use: No  . Drug Use: No  . Sexual Activity: Not on file   Other Topics Concern  . Not on file   Social History Narrative  . No narrative on file    FAMILY HISTORY: Family History  Problem Relation Age of Onset  . Heart murmur Mother   . Diabetes type II Mother   . Kidney cancer Mother   . Heart disease Mother   . Cancer Mother     tumor on right kidney  . Breast cancer Paternal Aunt   . Cancer Paternal Aunt     breast  . Colon cancer Maternal Uncle     ALLERGIES:  is allergic to codeine and vicodin.  MEDICATIONS:  Current Outpatient Prescriptions  Medication Sig Dispense Refill  . aspirin 81 MG tablet Take 81 mg by mouth daily.    . calcium citrate-vitamin D (CITRACAL+D) 315-200 MG-UNIT per tablet Take 1 tablet by mouth 2 (two) times daily.    . Cyanocobalamin (VITAMIN B 12 PO) Take by  mouth daily.    . fish oil-omega-3 fatty acids 1000 MG capsule Take 2 g by mouth daily.    [name] LECITHIN PO Take by mouth daily.    [name] loratadine (CLARITIN) 10 MG tablet Take 10 mg by mouth daily.    . Multiple Vitamins-Minerals (CENTRUM SILVER PO) Take by mouth daily.     No current facility-administered medications for this visit.    REVIEW OF SYSTEMS:   Constitutional: Denies fevers, chills or abnormal night sweats Eyes: Denies blurriness of vision, double vision or watery eyes Ears, nose, mouth, throat, and face: Denies mucositis or sore throat Respiratory: Denies cough, dyspnea or wheezes Cardiovascular: Denies  palpitation, chest discomfort or lower extremity swelling Gastrointestinal:  Denies nausea, heartburn or change in bowel habits Skin: Denies abnormal skin rashes Lymphatics: Denies new lymphadenopathy or easy bruising Neurological:Denies numbness, tingling or new weaknesses Behavioral/Psych: Mood is stable, no new changes  All other systems were reviewed with the patient and are negative.  PHYSICAL EXAMINATION: ECOG PERFORMANCE STATUS: 1 - Symptomatic but completely ambulatory  Filed Vitals:   04/20/15 1519  BP: 141/76  Pulse: 76  Temp: 98.1 F (36.7 C)  Resp: 20   Filed Weights   04/20/15 1519  Weight: 223 lb 1.6 oz (101.197 kg)    GENERAL:alert, no distress and comfortable SKIN: skin color, texture, turgor are normal, no rashes or significant lesions EYES: normal, conjunctiva are pink and non-injected, sclera clear OROPHARYNX:no exudate, no erythema and lips, buccal mucosa, and tongue normal  NECK: supple, thyroid normal size, non-tender, without nodularity LYMPH:  no palpable lymphadenopathy in the cervical, axillary or inguinal LUNGS: clear to auscultation and percussion with normal breathing effort HEART: regular rate & rhythm and no murmurs and no lower extremity edema ABDOMEN:abdomen soft, non-tender and normal bowel sounds Musculoskeletal:no cyanosis of digits and no clubbing  PSYCH: alert & oriented x 3 with fluent speech NEURO: no focal motor/sensory deficits  LABORATORY DATA:  I have reviewed the data as listed Lab Results  Component Value Date   WBC 6.1 10/19/2009   HGB 13.1 10/19/2009   HCT 38.3 10/19/2009   MCV 92.3 10/19/2009   PLT 223 10/19/2009   Lab Results  Component Value Date   NA 142 10/19/2009   K 4.3 10/19/2009   CL 109 10/19/2009   CO2 20 10/19/2009   ASSESSMENT AND PLAN:  Breast cancer of upper-outer quadrant of left female breast ([facility]) Left lumpectomy with sentinel node biopsy 10/27/2008  invasive ductal carcinoma, grade 3, 0.5 cm.  Margins negative. One of one lymph node negative ER/PR 0%, HER-2 negative ratio 0.92, Ki-67 17%, T1b N0 stage IA (Oncotype DX score 29-risk of recurrence 96%, uncertain why Oncotype was done, ER negative breast cancer)  New breast primary 03/23/2015: Left breast biopsy: Invasive ductal carcinoma with DCIS with necrosis, grade 2, ER 100%, PR 0%, HER-2 positive ratio 3.34, Ki-67 80% Left breast loosely grouped pleomorphic calcifications measuring 3 cm T2 N0 stage II a clinical stage  Pathology and radiology review:Discussed with the patient, the details of pathology including the type of breast cancer,the clinical staging, the significance of ER, PR and HER-2/neu receptors and the implications for treatment. After reviewing the pathology in detail, we proceeded to discuss the different treatment options between surgery, radiation, chemotherapy, antiestrogen therapies.  Recommendation based on multidisciplinary tumor board: 1. Mastectomy with immediate reconstruction 2. Followed by adjuvant chemotherapy 3. Followed by antiestrogen therapy Since patient previously had radiation, she cannot undergo any further radiation therapy.  After  lengthy discussion, she decided that she would undergo mastectomy and would like to meet with plastic surgery to discuss the role of breast reconstruction immediate postoperatively.  I discussed the role of Herceptin in decreasing the risk of breast cancer recurrence by 50%. I also discussed that Herceptin works more effectively when combined with chemotherapy. However single agent Herceptin may also have some benefit. I discussed the pros and cons of Herceptin including the risk of reversible cardiomyopathy.  Plan: After surgery, I will see her back and most likely she will receive Herceptin every 3 weeks 1 year with anastrozole without chemotherapy. We will await final pathology report to see the tumor size before finalizing the role of Herceptin. If the final tumor is  less than 0.5 cm and we would not need to do Herceptin.  Return to clinic after surgery to finalize adjuvant treatment plan.   All questions were answered. The patient knows to call the clinic with any problems, questions or concerns.    Rulon Eisenmenger, MD 04/20/2015

## 2015-04-20 NOTE — Addendum Note (Signed)
Addended by: Prentiss Bells on: 04/20/2015 06:39 PM   Modules accepted: Orders, Medications

## 2015-04-20 NOTE — Assessment & Plan Note (Signed)
Left lumpectomy with sentinel node biopsy 10/27/2008  invasive ductal carcinoma, grade 3, 0.5 cm. Margins negative. One of one lymph node negative ER/PR 0%, HER-2 negative ratio 0.92, Ki-67 17%, T1b N0 stage IA (Oncotype DX score 29-risk of recurrence 24%, uncertain why Oncotype was done, ER negative breast cancer)  New breast primary 03/23/2015: Left breast biopsy: Invasive ductal carcinoma with DCIS with necrosis, grade 2, ER 100%, PR 0%, HER-2 positive ratio 3.34, Ki-67 80% Left breast loosely grouped pleomorphic calcifications measuring 3 cm T2 N0 stage II a clinical stage  Pathology and radiology review:Discussed with the patient, the details of pathology including the type of breast cancer,the clinical staging, the significance of ER, PR and HER-2/neu receptors and the implications for treatment. After reviewing the pathology in detail, we proceeded to discuss the different treatment options between surgery, radiation, chemotherapy, antiestrogen therapies.  Recommendation based on multidisciplinary tumor board: 1. Mastectomy with immediate reconstruction 2. Followed by adjuvant chemotherapy 3. Followed by antiestrogen therapy Since patient previously had radiation, she cannot undergo any further radiation therapy. Patient is reluctant to consider mastectomy on chemotherapy. I discussed the role of Herceptin in decreasing the risk of breast cancer recurrence by 50%. I also discussed that Herceptin works more effectively when combined with chemotherapy. However single agent Herceptin may also have some benefit. I discussed the pros and cons of Herceptin including the risk of irreversible cardiomyopathy.  Return to clinic after surgery to finalize adjuvant treatment plan.

## 2015-04-26 ENCOUNTER — Encounter: Payer: Self-pay | Admitting: Radiation Oncology

## 2015-04-26 NOTE — Progress Notes (Signed)
Location of Breast Cancer: left breast cancer  Histology per Pathology Report:   03/23/15 Diagnosis Breast, left, needle core biopsy - INVASIVE DUCTAL CARCINOMA. - DUCTAL CARCINOMA IN SITU WITH NECROSIS. - SEE COMMENT.  Receptor Status: ER(100%), PR (0%), Her2-neu (positive)  Did patient present with symptoms (if so, please note symptoms) or was this found on screening mammography?: screening mammography - also reports having calicifications in her left breast back in May.  Past/Anticipated interventions by surgeon, if any: left breast biopsy 03/23/15  Past/Anticipated interventions by medical oncology, if any: after surgery she will have Herceptin every 3 weeks 1 year with anastrozole.  Lymphedema issues, if any:  no  Pain issues, if any:  no   SAFETY ISSUES:  Prior radiation? 02/01/09-03/22/09 -left breast 5040 cGy   Pacemaker/ICD? no  Possible current pregnancy?no  Is the patient on methotrexate? no  Current Complaints / other details:  Patient had left breast cancer treated with radiation in 2010.    BP 140/102 mmHg  Pulse 70  Temp(Src) 98.5 F (36.9 C) (Oral)  Ht '5\' 10"'$  (1.778 m)  Wt 225 lb 4.8 oz (102.195 kg)  BMI 32.33 kg/m2

## 2015-04-28 ENCOUNTER — Ambulatory Visit
Admission: RE | Admit: 2015-04-28 | Discharge: 2015-04-28 | Disposition: A | Discharge status: Home / Self Care | Payer: Medicare Other | Source: Ambulatory Visit | Attending: Radiation Oncology | Admitting: Radiation Oncology

## 2015-04-28 ENCOUNTER — Encounter: Payer: Self-pay | Admitting: Radiation Oncology

## 2015-04-28 VITALS — BP 140/102 | HR 70 | Temp 98.5°F | Ht 70.0 in | Wt 225.3 lb

## 2015-04-28 DIAGNOSIS — Z803 Family history of malignant neoplasm of breast: Secondary | ICD-10-CM | POA: Diagnosis not present

## 2015-04-28 DIAGNOSIS — C50312 Malignant neoplasm of lower-inner quadrant of left female breast: Secondary | ICD-10-CM | POA: Diagnosis not present

## 2015-04-28 DIAGNOSIS — J45909 Unspecified asthma, uncomplicated: Secondary | ICD-10-CM | POA: Diagnosis not present

## 2015-04-28 DIAGNOSIS — Z170 Estrogen receptor positive status [ER+]: Secondary | ICD-10-CM | POA: Insufficient documentation

## 2015-04-28 HISTORY — DX: Malignant neoplasm of unspecified site of unspecified female breast: C50.919

## 2015-04-28 HISTORY — DX: Reserved for inherently not codable concepts without codable children: IMO0001

## 2015-04-28 HISTORY — DX: Reserved for concepts with insufficient information to code with codable children: IMO0002

## 2015-04-28 NOTE — Progress Notes (Signed)
Please see the Nurse Progress Note in the MD Initial Consult Encounter for this patient. 

## 2015-04-28 NOTE — Progress Notes (Signed)
Radiation Oncology         [phone] ________________________________  Outpatient Re-Consultation  Name: Ana Woodward MRN: [id number]  Date: 04/28/2015  DOB: 02/20/1946  SF:[id number],TZGY Ana Hawking, MD  Ana Lass, MD   REFERRING PHYSICIAN: Kathyrn Lass, MD  DIAGNOSIS:    ICD-9-CM ICD-10-CM   1. Breast cancer of lower-inner quadrant of left female breast ([facility]CC) 174.3 C50.312       Grade 2 invasive ductal carcinoma of the left breast, ER (100%), PR (0%), Her2-neu positive with a ratio of 3.34, Ki67 of 80%.  HISTORY OF PRESENT ILLNESS::Ana Woodward is a 70 y.o. female who presents to the clinic with an abnormal mammogram. She had a biopsy on her left breast on 03/23/2015, which revealed invasive ductal carcinoma in situ with necrosis and is ER(100%), PR(0%), and Her2-neu positive. She had a prior history of left breast cancer in 2010 when she had a triple negative disease 0.5 cm tumor size stage IA. She underwent radiation followed by surveillance.  She denies nipple discharge or bleeding. She denies breast pain.  PREVIOUS RADIATION THERAPY: Yes. She had radiation on 02/01/2009-03/22/2009 on the left breast at 5040 cGy. lumpectomy cavity boost to 6300 cGy. prior  breast cancer was located in the lateral aspect of the left breast  PAST MEDICAL HISTORY:  has a past medical history of Asthma; History of breast cancer; Seasonal allergies; H/O colonoscopy (2016); H/O bone density study (2010); Pap smear for cervical cancer screening (2010); Radiation (02/01/09-03/22/09); and Breast cancer ([place]).    PAST SURGICAL HISTORY: Past Surgical History  Procedure Laterality Date  . Breast lumpectomy  11/06/2008    left with sentinel node biopsy  . Eye surgery  10/13/2011, 11/10/2011    right and left  . Hernia repair Right 1959  . Breast biopsy Left 03/23/15    FAMILY HISTORY: family history includes Breast cancer in her paternal aunt; Cancer in her mother and paternal aunt; Colon cancer in her  maternal uncle; Diabetes type II in her mother; Heart disease in her mother; Heart murmur in her mother; Kidney cancer in her mother.  SOCIAL HISTORY:  reports that she has never smoked. She has never used smokeless tobacco. She reports that she does not drink alcohol or use illicit drugs.  ALLERGIES: Codeine and Vicodin  MEDICATIONS:  Current Outpatient Prescriptions  Medication Sig Dispense Refill  . BROCCOLI EXTRACT PO Take 500 mg by mouth daily.    . Coenzyme Q10 (COQ10) 100 MG CAPS Take 100 mg by mouth daily.    . IODINE, KELP, PO Take by mouth.    . loratadine (CLARITIN) 10 MG tablet Take 10 mg by mouth daily.    . Turmeric 500 MG CAPS Take 500 mg by mouth daily.    [name] UNABLE TO FIND Take 3 g by mouth daily. Kale    . aspirin 81 MG tablet Take 81 mg by mouth daily. Reported on 04/28/2015    . Cyanocobalamin (VITAMIN B 12 PO) Take by mouth daily. Reported on 04/28/2015    . Magnesium 250 MG TABS Take 250 mg by mouth daily. Reported on 04/28/2015    . Multiple Vitamins-Minerals (CENTRUM SILVER ADULT 50+ PO) Take by mouth.     No current facility-administered medications for this encounter.    REVIEW OF SYSTEMS:  A 15 point review of systems is documented in the electronic medical record. This was obtained by the nursing staff. However, I reviewed this with the patient to discuss relevant findings and make appropriate  changes.  Pertinent items are noted in HPI.   PHYSICAL EXAM:  height is _0  (1.778 m) and weight is 225 lb 4.8 oz (102.195 kg). Her oral temperature is 98.5 F (36.9 C). Her blood pressure is 140/102 and her pulse is 70.   BP 140/102 mmHg  Pulse 70  Temp(Src) 98.5 F (36.9 C) (Oral)  Ht _1  (1.778 m)  Wt 225 lb 4.8 oz (102.195 kg)  BMI 32.33 kg/m2 General: Alert and oriented, in no acute distress HEENT: Head is normocephalic. Extraocular movements are intact. Oropharynx is clear. Neck: Neck is supple, no palpable cervical or supraclavicular  lymphadenopathy. Heart: Regular in rate and rhythm with no murmurs, rubs, or gallops. Chest: Clear to auscultation bilaterally, with no rhonchi, wheezes, or rales. Abdomen: Soft, nontender, nondistended, with no rigidity or guarding. Extremities: No cyanosis or edema. Lymphatics: see Neck Exam Skin: No concerning lesions. Musculoskeletal: symmetric strength and muscle tone throughout. Neurologic:  No obvious focalities. Speech is fluent. Coordination is intact. Psychiatric: Judgment and insight are intact. Affect is appropriate No palp mass in right breast or discharge. Left breast shows lumpectomy scar in 3 o'clock position adjacent to the nipple area. The nipple is retracted laterally from her prior surgery. Patient has a small biopsy site in the lower inner quadrant. No palpable mass in this area.  ECOG = 1   LABORATORY DATA:  Lab Results  Component Value Date   WBC 6.1 10/19/2009   HGB 13.1 10/19/2009   HCT 38.3 10/19/2009   MCV 92.3 10/19/2009   PLT 223 10/19/2009   NEUTROABS 3.8 10/19/2009   Lab Results  Component Value Date   NA 142 10/19/2009   K 4.3 10/19/2009   CL 109 10/19/2009   CO2 20 10/19/2009   GLUCOSE 115* 10/19/2009   CREATININE 0.82 10/19/2009   CALCIUM 9.2 10/19/2009      RADIOGRAPHY: Imaging performed at [facility]. Reports reviewed today  IMPRESSION: Ana Woodward is a 70 yo female with a history of triple negative stage IA left breast cancer. She now has a new breast cancer presenting in the opposite quadrant of the same breast, pathology also different from her prior breast cancer ( grade 2 invasive ductal carcinoma in her left breast that is ER 100%, PR 0%, Her2-neu positive, and a Ki67 of 80%). Given the patient's prior history of left breast radiation therapy,  she is not a candidate for breast conserving therapy. I discussed with the patient that she would require a mastectomy in this situation. Patient understands this issue.  PLAN: It was  recommended that the patient  have a mastectomy. She is interested in immediate reconstruction. This will be followed by adjuvant chemotherapy, followed by antiestrogen therapy.  She has an appointment with plastics tomorrow to discuss breast reconstruction.     ------------------------------------------------  Blair Promise, PhD, MD    This document serves as a record of services personally performed by Gery Pray, MD. It was created on his behalf by Lendon Collar, a trained medical scribe. The creation of this record is based on the scribe's personal observations and the provider's statements to them. This document has been checked and approved by the attending provider.

## 2015-04-28 NOTE — Addendum Note (Signed)
Encounter addended by: Jacqulyn Liner, RN on: 04/28/2015  2:33 PM<BR>     Documentation filed: Charges VN

## 2015-05-04 ENCOUNTER — Ambulatory Visit: Payer: Self-pay | Admitting: Surgery

## 2015-05-11 ENCOUNTER — Other Ambulatory Visit: Payer: Self-pay | Admitting: Surgery

## 2015-05-12 ENCOUNTER — Other Ambulatory Visit: Payer: Self-pay | Admitting: Surgery

## 2015-05-12 DIAGNOSIS — C50912 Malignant neoplasm of unspecified site of left female breast: Secondary | ICD-10-CM

## 2015-05-20 ENCOUNTER — Other Ambulatory Visit: Payer: Self-pay | Admitting: *Deleted

## 2015-05-24 ENCOUNTER — Telehealth: Payer: Self-pay | Admitting: Hematology and Oncology

## 2015-05-24 NOTE — Telephone Encounter (Signed)
Unable to reach patient to inform her of March appointment per MD 2/9 pof. Calendar sent by mail

## 2015-05-26 ENCOUNTER — Encounter (HOSPITAL_BASED_OUTPATIENT_CLINIC_OR_DEPARTMENT_OTHER): Payer: Self-pay | Admitting: *Deleted

## 2015-06-01 NOTE — H&P (Signed)
Ana Woodward  Location: [facility] Patient #: [id number] DOB: 1946-01-29 Married / Language: English / Race: Black or African American Female   History of Present Illness (Cheyne Bungert A. Ninfa Linden MD; The patient is a 70 year old female who presents with breast cancer. This is a former breast cancer patient of Dr. Dickie La who is status post a left breast lumpectomy with sentinel lymph node biopsy, postoperative radiation, and anti-hormonal therapy in 2010. Most recently, on screening mammography she was found to have calcifications in the inner lower quadrant of the left breast. 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 She has no plates regarding her breasts. She is otherwise without complaints and is healthy and doing well   Other Problems Elbert Ewings, CMA; Breast Cancer Inguinal Hernia Sleep Apnea  Past Surgical History Elbert Ewings, CMA; Breast Mass; Local Excision Left.  Diagnostic Studies History Elbert Ewings, CMA;  Colonoscopy within last year Mammogram within last year Pap Smear >5 years ago  Allergies Elbert Ewings, Tracy;  Codeine Sulfate *ANALGESICS - OPIOID* Vicodin *ANALGESICS - OPIOID*  Medication History Elbert Ewings, CMA;  Multiple Vitamins (Oral) Active. Medications Reconciled  Pregnancy / Birth History Elbert Ewings, CMA; Age at menarche 78 years. Age of menopause 51-55 Contraceptive History Intrauterine device, Oral contraceptives. Gravida 5 Maternal age 58-20 Para 4 Regular periods    Review of Systems Elbert Ewings CMA; General Not Present- Appetite Loss, Chills, Fatigue, Fever, Night Sweats, Weight Gain and Weight Loss. Skin Not Present- Change in Wart/Mole, Dryness, Hives, Jaundice, New Lesions, Non-Healing Wounds, Rash and Ulcer. HEENT Present- Wears glasses/contact lenses. Not Present- Earache, Hearing Loss, Hoarseness, Nose Bleed, Oral Ulcers, Ringing  in the Ears, Seasonal Allergies, Sinus Pain, Sore Throat, Visual Disturbances and Yellow Eyes. Respiratory Present- Snoring. Not Present- Bloody sputum, Chronic Cough, Difficulty Breathing and Wheezing. Cardiovascular Not Present- Chest Pain, Difficulty Breathing Lying Down, Leg Cramps, Palpitations, Rapid Heart Rate, Shortness of Breath and Swelling of Extremities. Gastrointestinal Not Present- Abdominal Pain, Bloating, Bloody Stool, Change in Bowel Habits, Chronic diarrhea, Constipation, Difficulty Swallowing, Excessive gas, Gets full quickly at meals, Hemorrhoids, Indigestion, Nausea, Rectal Pain and Vomiting. Female Genitourinary Not Present- Frequency, Nocturia, Painful Urination, Pelvic Pain and Urgency. Musculoskeletal Not Present- Back Pain, Joint Pain, Joint Stiffness, Muscle Pain, Muscle Weakness and Swelling of Extremities. Neurological Not Present- Decreased Memory, Fainting, Headaches, Numbness, Seizures, Tingling, Tremor, Trouble walking and Weakness. Psychiatric Not Present- Anxiety, Bipolar, Change in Sleep Pattern, Depression, Fearful and Frequent crying. Endocrine Not Present- Cold Intolerance, Excessive Hunger, Hair Changes, Heat Intolerance, Hot flashes and New Diabetes. Hematology Present- Easy Bruising. Not Present- Excessive bleeding, Gland problems, HIV and Persistent Infections.  Vitals Weight: 231 lb Height: 70in Body Surface Area: 2.22 m Body Mass Index: 33.14 kg/m  Temp.: 98.14F(Temporal)  Pulse: 71 (Regular)  BP: 130/78 (Sitting, Left Arm, Standard)   Physical Exam  General Mental Status-Alert. General Appearance-Consistent with stated age. Hydration-Well hydrated. Voice-Normal.  Head and Neck Head-normocephalic, atraumatic with no lesions or palpable masses. Trachea-midline. Thyroid Gland Characteristics - normal size and consistency.  Eye Eyeball - Bilateral-Extraocular movements intact. Sclera/Conjunctiva - Bilateral-No  scleral icterus.  Chest and Lung Exam Chest and lung exam reveals -quiet, even and easy respiratory effort with no use of accessory muscles and on auscultation, normal breath sounds, no adventitious sounds and normal vocal resonance. Inspection Chest Wall - Normal. Back - normal.  Breast Breast - Left-Symmetric, Non Tender, No Biopsy scars, no Dimpling,  No Inflammation, No Lumpectomy scars, No Mastectomy scars, No Peau d' Orange. Breast - Right-Symmetric, Non Tender, No Biopsy scars, no Dimpling, No Inflammation, No Lumpectomy scars, No Mastectomy scars, No Peau d' Orange. Breast Lump-No Palpable Breast Mass.  Cardiovascular Cardiovascular examination reveals -normal heart sounds, regular rate and rhythm with no murmurs and normal pedal pulses bilaterally.  Abdomen Inspection Inspection of the abdomen reveals - No Hernias. Skin - Scar - no surgical scars. Palpation/Percussion Palpation and Percussion of the abdomen reveal - Soft, Non Tender, No Rebound tenderness, No Rigidity (guarding) and No hepatosplenomegaly. Auscultation Auscultation of the abdomen reveals - Bowel sounds normal.  Neurologic Neurologic evaluation reveals -alert and oriented x 3 with no impairment of recent or remote memory. Mental Status-Normal.  Musculoskeletal Normal Exam - Left-Upper Extremity Strength Normal and Lower Extremity Strength Normal. Normal Exam - Right-Upper Extremity Strength Normal and Lower Extremity Strength Normal.  Lymphatic Head & Neck  General Head & Neck Lymphatics: Bilateral - Description - Normal. Axillary  General Axillary Region: Bilateral - Description - Normal. Tenderness - Non Tender. Femoral & Inguinal  Generalized Femoral & Inguinal Lymphatics: Bilateral - Description - Normal. Tenderness - Non Tender.    Assessment & Plan  LEFT BREAST CANCER  Impression: I discussed the diagnosis with her in detail. She was also discussed at the multidisciplinary  breast conference this morning. Because of her previous breast cancer and radiation, mastectomy with Port-A-Cath insertion for chemotherapy and reconstruction had been recommended. I discussed the reasons for this in detail. She is currently against mastectomy and chemotherapy. She will discuss this with the medical oncologist. I told her this would not be the standard of care. She says she will just to have a lumpectomy and would agree to a sentinel node biopsy. She is wanting to think strongly about it and if she is somehow convinced that a mastectomy, she would allow reconstruction with native tissue. She is going to defer her final incision until after she sees oncology. She will let me know at that time. I will then be able to schedule her for surgery   ADDENDUM: she has met with medical and radiation oncology as well as plastic surgery.  Again, left mastectomy with reconstruction and port insertion for IV chemotherapy was recommended.  She again refuses this despite all of our recommendations.  She only agrees at this time to a radioactive seed localized left breast lumpectomy and sentinel node biopsy as well as excision of a small mass on her back. I discussed the risks including but not limited to bleeding, injury to surrounding structures, need for further surgery with positive margins, seroma formation, cardiopulmonary issues, etc.  She agrees to proceed.

## 2015-06-02 ENCOUNTER — Encounter (HOSPITAL_COMMUNITY): Payer: PPO

## 2015-06-02 ENCOUNTER — Encounter (HOSPITAL_BASED_OUTPATIENT_CLINIC_OR_DEPARTMENT_OTHER): Payer: Self-pay | Admitting: Certified Registered"

## 2015-06-02 ENCOUNTER — Encounter (HOSPITAL_COMMUNITY)
Admission: RE | Admit: 2015-06-02 | Discharge: 2015-06-02 | Disposition: A | Discharge status: Home / Self Care | Payer: Medicare Other | Source: Ambulatory Visit | Attending: Surgery | Admitting: Surgery

## 2015-06-02 ENCOUNTER — Ambulatory Visit (HOSPITAL_BASED_OUTPATIENT_CLINIC_OR_DEPARTMENT_OTHER)
Admission: RE | Admit: 2015-06-02 | Discharge: 2015-06-02 | Disposition: A | Discharge status: Home / Self Care | Payer: Medicare Other | Source: Ambulatory Visit | Attending: Surgery | Admitting: Surgery

## 2015-06-02 ENCOUNTER — Encounter (HOSPITAL_BASED_OUTPATIENT_CLINIC_OR_DEPARTMENT_OTHER)
Admission: RE | Disposition: A | Discharge status: Home / Self Care | Payer: Self-pay | Source: Ambulatory Visit | Attending: Surgery

## 2015-06-02 ENCOUNTER — Ambulatory Visit (HOSPITAL_BASED_OUTPATIENT_CLINIC_OR_DEPARTMENT_OTHER): Payer: Medicare Other | Admitting: Certified Registered"

## 2015-06-02 DIAGNOSIS — D171 Benign lipomatous neoplasm of skin and subcutaneous tissue of trunk: Secondary | ICD-10-CM | POA: Diagnosis not present

## 2015-06-02 DIAGNOSIS — Z853 Personal history of malignant neoplasm of breast: Secondary | ICD-10-CM | POA: Insufficient documentation

## 2015-06-02 DIAGNOSIS — E669 Obesity, unspecified: Secondary | ICD-10-CM | POA: Diagnosis not present

## 2015-06-02 DIAGNOSIS — C50312 Malignant neoplasm of lower-inner quadrant of left female breast: Secondary | ICD-10-CM | POA: Insufficient documentation

## 2015-06-02 DIAGNOSIS — Z170 Estrogen receptor positive status [ER+]: Secondary | ICD-10-CM | POA: Insufficient documentation

## 2015-06-02 DIAGNOSIS — Z6831 Body mass index (BMI) 31.0-31.9, adult: Secondary | ICD-10-CM | POA: Insufficient documentation

## 2015-06-02 DIAGNOSIS — Z923 Personal history of irradiation: Secondary | ICD-10-CM | POA: Diagnosis not present

## 2015-06-02 DIAGNOSIS — C50912 Malignant neoplasm of unspecified site of left female breast: Secondary | ICD-10-CM

## 2015-06-02 DIAGNOSIS — J45909 Unspecified asthma, uncomplicated: Secondary | ICD-10-CM | POA: Insufficient documentation

## 2015-06-02 HISTORY — DX: Snoring: R06.83

## 2015-06-02 HISTORY — PX: MASS EXCISION: SHX2000

## 2015-06-02 HISTORY — PX: BREAST LUMPECTOMY WITH RADIOACTIVE SEED AND SENTINEL LYMPH NODE BIOPSY: SHX6550

## 2015-06-02 SURGERY — BREAST LUMPECTOMY WITH RADIOACTIVE SEED AND SENTINEL LYMPH NODE BIOPSY
Anesthesia: General

## 2015-06-02 MED ORDER — SODIUM CHLORIDE 0.9 % IJ SOLN
INTRAVENOUS | Status: DC | PRN
  Administered 2015-06-02: 2.5 mL

## 2015-06-02 MED ORDER — GLYCOPYRROLATE 0.2 MG/ML IJ SOLN: 0.2000 mg | Freq: Once | INTRAMUSCULAR | Status: DC | PRN

## 2015-06-02 MED ORDER — EPHEDRINE SULFATE 50 MG/ML IJ SOLN
INTRAMUSCULAR | Status: DC | PRN
  Administered 2015-06-02 (×2): 10 mg via INTRAVENOUS

## 2015-06-02 MED ORDER — LIDOCAINE HCL (CARDIAC) 20 MG/ML IV SOLN
INTRAVENOUS | Status: AC
  Filled 2015-06-02: qty 5

## 2015-06-02 MED ORDER — FENTANYL CITRATE (PF) 100 MCG/2ML IJ SOLN
INTRAMUSCULAR | Status: AC
  Filled 2015-06-02: qty 2

## 2015-06-02 MED ORDER — DEXAMETHASONE SODIUM PHOSPHATE 10 MG/ML IJ SOLN
INTRAMUSCULAR | Status: AC
Start: 2015-06-02 — End: 2015-06-02
  Filled 2015-06-02: qty 1

## 2015-06-02 MED ORDER — FENTANYL CITRATE (PF) 100 MCG/2ML IJ SOLN
50.0000 ug | INTRAMUSCULAR | Status: AC | PRN
  Administered 2015-06-02: 25 ug via INTRAVENOUS
  Administered 2015-06-02: 50 ug via INTRAVENOUS
  Administered 2015-06-02 (×3): 25 ug via INTRAVENOUS

## 2015-06-02 MED ORDER — LACTATED RINGERS IV SOLN
INTRAVENOUS | Status: DC
  Administered 2015-06-02 (×2): via INTRAVENOUS

## 2015-06-02 MED ORDER — LIDOCAINE HCL (CARDIAC) 20 MG/ML IV SOLN
INTRAVENOUS | Status: DC | PRN
  Administered 2015-06-02: 60 mg via INTRAVENOUS

## 2015-06-02 MED ORDER — DEXAMETHASONE SODIUM PHOSPHATE 4 MG/ML IJ SOLN
INTRAMUSCULAR | Status: DC | PRN
  Administered 2015-06-02: 10 mg via INTRAVENOUS

## 2015-06-02 MED ORDER — OXYCODONE HCL 5 MG/5ML PO SOLN: 5.0000 mg | Freq: Once | ORAL | Status: DC | PRN

## 2015-06-02 MED ORDER — FENTANYL CITRATE (PF) 100 MCG/2ML IJ SOLN: 25.0000 ug | INTRAMUSCULAR | Status: DC | PRN

## 2015-06-02 MED ORDER — SODIUM CHLORIDE 0.9 % IJ SOLN
INTRAMUSCULAR | Status: AC
  Filled 2015-06-02: qty 10

## 2015-06-02 MED ORDER — SCOPOLAMINE 1 MG/3DAYS TD PT72: 1.0000 | MEDICATED_PATCH | Freq: Once | TRANSDERMAL | Status: DC | PRN

## 2015-06-02 MED ORDER — BUPIVACAINE-EPINEPHRINE 0.5% -1:200000 IJ SOLN
INTRAMUSCULAR | Status: DC | PRN
  Administered 2015-06-02: 26 mL

## 2015-06-02 MED ORDER — OXYCODONE HCL 5 MG PO TABS: 5.0000 mg | ORAL_TABLET | Freq: Once | ORAL | Status: DC | PRN

## 2015-06-02 MED ORDER — CEFAZOLIN SODIUM-DEXTROSE 2-3 GM-% IV SOLR
2.0000 g | INTRAVENOUS | Status: AC
  Administered 2015-06-02: 2 g via INTRAVENOUS

## 2015-06-02 MED ORDER — PROPOFOL 10 MG/ML IV BOLUS
INTRAVENOUS | Status: DC | PRN
  Administered 2015-06-02: 150 mg via INTRAVENOUS

## 2015-06-02 MED ORDER — TECHNETIUM TC 99M SULFUR COLLOID FILTERED
1.0000 | Freq: Once | INTRAVENOUS | Status: AC | PRN
  Administered 2015-06-02: 1 via INTRADERMAL

## 2015-06-02 MED ORDER — CEFAZOLIN SODIUM-DEXTROSE 2-3 GM-% IV SOLR
INTRAVENOUS | Status: AC
  Filled 2015-06-02: qty 50

## 2015-06-02 MED ORDER — MIDAZOLAM HCL 2 MG/2ML IJ SOLN
INTRAMUSCULAR | Status: AC
  Filled 2015-06-02: qty 2

## 2015-06-02 MED ORDER — TRAMADOL HCL 50 MG PO TABS: 50.0000 mg | ORAL_TABLET | Freq: Four times a day (QID) | ORAL | Status: DC | PRN

## 2015-06-02 MED ORDER — ONDANSETRON HCL 4 MG/2ML IJ SOLN
INTRAMUSCULAR | Status: AC
  Filled 2015-06-02: qty 2

## 2015-06-02 MED ORDER — ONDANSETRON HCL 4 MG/2ML IJ SOLN: 4.0000 mg | Freq: Four times a day (QID) | INTRAMUSCULAR | Status: DC | PRN

## 2015-06-02 MED ORDER — ONDANSETRON HCL 4 MG/2ML IJ SOLN
INTRAMUSCULAR | Status: DC | PRN
  Administered 2015-06-02: 4 mg via INTRAVENOUS

## 2015-06-02 MED ORDER — MIDAZOLAM HCL 2 MG/2ML IJ SOLN
1.0000 mg | INTRAMUSCULAR | Status: DC | PRN
  Administered 2015-06-02: 1 mg via INTRAVENOUS

## 2015-06-02 SURGICAL SUPPLY — 49 items
APPLIER CLIP 9.375 MED OPEN (MISCELLANEOUS)
APR CLP MED 9.3 20 MLT OPN (MISCELLANEOUS)
BLADE HEX COATED 2.75 (ELECTRODE) ×4 IMPLANT
BLADE SURG 15 STRL LF DISP TIS (BLADE) ×2 IMPLANT
BLADE SURG 15 STRL SS (BLADE) ×8
CANISTER SUCT 1200ML W/VALVE (MISCELLANEOUS) IMPLANT
CHLORAPREP W/TINT 26ML (MISCELLANEOUS) ×6 IMPLANT
CLIP APPLIE 9.375 MED OPEN (MISCELLANEOUS) ×2 IMPLANT
CLIP TI WIDE RED SMALL 6 (CLIP) IMPLANT
COVER BACK TABLE 60X90IN (DRAPES) ×4 IMPLANT
COVER MAYO STAND STRL (DRAPES) ×4 IMPLANT
COVER PROBE W GEL 5X96 (DRAPES) ×4 IMPLANT
DECANTER SPIKE VIAL GLASS SM (MISCELLANEOUS) IMPLANT
DEVICE DUBIN W/COMP PLATE 8390 (MISCELLANEOUS) ×4 IMPLANT
DRAPE LAPAROSCOPIC ABDOMINAL (DRAPES) ×4 IMPLANT
DRAPE UTILITY XL STRL (DRAPES) ×6 IMPLANT
ELECT REM PT RETURN 9FT ADLT (ELECTROSURGICAL) ×4
ELECTRODE REM PT RTRN 9FT ADLT (ELECTROSURGICAL) ×2 IMPLANT
GLOVE BIOGEL PI IND STRL 7.0 (GLOVE) IMPLANT
GLOVE BIOGEL PI INDICATOR 7.0 (GLOVE) ×2
GLOVE ECLIPSE 6.5 STRL STRAW (GLOVE) ×2 IMPLANT
GLOVE EXAM NITRILE MD LF STRL (GLOVE) ×2 IMPLANT
GLOVE SURG SIGNA 7.5 PF LTX (GLOVE) ×4 IMPLANT
GOWN STRL REUS W/ TWL LRG LVL3 (GOWN DISPOSABLE) ×2 IMPLANT
GOWN STRL REUS W/ TWL XL LVL3 (GOWN DISPOSABLE) ×2 IMPLANT
GOWN STRL REUS W/TWL LRG LVL3 (GOWN DISPOSABLE) ×4
GOWN STRL REUS W/TWL XL LVL3 (GOWN DISPOSABLE) ×8
KIT MARKER MARGIN INK (KITS) ×4 IMPLANT
LIQUID BAND (GAUZE/BANDAGES/DRESSINGS) ×6 IMPLANT
NDL HYPO 25X1 1.5 SAFETY (NEEDLE) ×2 IMPLANT
NDL SAFETY ECLIPSE 18X1.5 (NEEDLE) ×2 IMPLANT
NEEDLE HYPO 18GX1.5 SHARP (NEEDLE) ×4
NEEDLE HYPO 25X1 1.5 SAFETY (NEEDLE) ×4 IMPLANT
NS IRRIG 1000ML POUR BTL (IV SOLUTION) ×4 IMPLANT
PACK BASIN DAY SURGERY FS (CUSTOM PROCEDURE TRAY) ×4 IMPLANT
PENCIL BUTTON HOLSTER BLD 10FT (ELECTRODE) ×4 IMPLANT
SLEEVE SCD COMPRESS KNEE MED (MISCELLANEOUS) ×4 IMPLANT
SPONGE GAUZE 4X4 12PLY STER LF (GAUZE/BANDAGES/DRESSINGS) IMPLANT
SPONGE LAP 4X18 X RAY DECT (DISPOSABLE) ×6 IMPLANT
SUT MNCRL AB 4-0 PS2 18 (SUTURE) ×6 IMPLANT
SUT SILK 2 0 SH (SUTURE) ×2 IMPLANT
SUT VIC AB 3-0 SH 27 (SUTURE) ×8
SUT VIC AB 3-0 SH 27X BRD (SUTURE) ×2 IMPLANT
SYR CONTROL 10ML LL (SYRINGE) ×6 IMPLANT
TOWEL OR 17X24 6PK STRL BLUE (TOWEL DISPOSABLE) ×4 IMPLANT
TOWEL OR NON WOVEN STRL DISP B (DISPOSABLE) ×4 IMPLANT
TUBE CONNECTING 20'X1/4 (TUBING)
TUBE CONNECTING 20X1/4 (TUBING) IMPLANT
YANKAUER SUCT BULB TIP NO VENT (SUCTIONS) IMPLANT

## 2015-06-02 NOTE — Op Note (Signed)
LEFT BREAST LUMPECTOMY WITH RADIOACTIVE SEED AND SENTINEL LYMPH NODE BIOPSY, EXCISION BACK  MASS  Procedure Note  Ana Woodward 06/02/2015   Pre-op Diagnosis: Left breast cancer, back mass      Post-op Diagnosis: same  Procedure(s): LEFT BREAST LUMPECTOMY WITH RADIOACTIVE SEED AND SENTINEL LYMPH NODE BIOPSY EXCISION BACK  MASS (6 cm ) INJECTION OF BLUE DYE  Surgeon(s): Coralie Keens, MD  Anesthesia: General  Staff:  Circulator: Maurene Capes, RN Scrub Person: Tresa Res, RN  Estimated Blood Loss: Minimal               Specimens: sent to path          [facility]   Date: 06/02/2015  Time: 11:20 AM

## 2015-06-02 NOTE — Op Note (Signed)
Ana Woodward, Ana Woodward               ACCOUNT NO.:  [id number]  MEDICAL RECORD NO.:  [id number]  LOCATION:  [facility]                          FACILITY:  [facility]  PHYSICIAN:  Coralie Keens, M.D. DATE OF BIRTH:  15-Jun-1945  DATE OF PROCEDURE:  06/02/2015 DATE OF DISCHARGE:                              OPERATIVE REPORT   PREOPERATIVE DIAGNOSES: 1. Left breast cancer. 2. Left back mass.  POSTOPERATIVE DIAGNOSES: 1. Left breast cancer. 2. Left back mass.  PROCEDURES: 1. Excision of 6-cm left back mass. 2. Radioactive seed localized left breast lumpectomy. 3. Excision of left axillary lymph node. 4. Injection of blue dye.  SURGEON:  Coralie Keens, M.D.  ANESTHESIA:  General and 0.5% Marcaine.  ESTIMATED BLOOD LOSS:  Minimal.  INDICATIONS:  This is a 70 year old female who has had a recurrent left breast cancer.  She has had previous lumpectomy with sentinel node biopsy and radiation therapy as well as antihormonal therapy.  She has refused Ana mastectomy and Port-A-Cath insertion.  She has agreed only to lumpectomy and sentinel node biopsy as well as excision of a symptomatic mass on her back.  FINDINGS:  Ana mass in Ana back was 6 cm and subcutaneous consistent with lipoma.  Ana radioactive isotope injected around Ana areola and Ana blue dye did not go to Ana axilla.  There was one palpable lymph node in Ana axilla, which I excised.  Ana radioactive seed and previous marker were in Ana lumpectomy specimen.  PROCEDURE IN DETAIL:  Ana patient was identified in Ana holding area as Ana Woodward.  Ana Neoprobe was used to confirmed that Ana radioactive seed was indeed in Ana left breast.  She was then taken to Ana operating room.  She was placed in Ana supine position on Ana operating table and general anesthesia was induced.  Ana patient was then placed in Ana right lateral decubitus position.  Her back was then prepped and draped in usual sterile fashion.  I anesthetized Ana  skin over Ana palpable mass with Marcaine.  I then made a longitudinal incision with a scalpel. I took this down to Ana subcutaneous tissue with Ana cautery and then excised a mobile 6-cm mass, which appeared consistent with lipoma.  It was sent to Pathology for evaluation.  I then closed Ana subcutaneous tissue with interrupted 3-0 Vicryl sutures and closed Ana skin with running 4-0 Monocryl.  Ana patient was then placed back into Ana supine position.  I then injected blue dye underneath Ana areola of Ana left breast and massaged Ana breast.  Ana patient's left breast and axilla were then prepped and draped in usual sterile fashion.  Ana Neoprobe was again brought onto Ana field.  I then identified Ana area of increased uptake in Ana inner lower quadrant of Ana breast.  I then made a longitudinal incision over Ana area with Ana scalpel after anesthetized with Marcaine.  I then performed a wide lumpectomy going around Ana radioactive seed with Ana aid of Ana Neoprobe.  I took this all Ana way down to Ana chest wall and also underneath Ana inferior aspect of Ana areola.  Once Ana lumpectomy specimen was completely excised, I confirmed Ana radioactive seed  was in Ana specimen.  I marked all margins with marker paint.  X-ray confirmed that Ana radioactive seed and previous marker were in Ana breast specimen.  Again, it had been marked with marker paint and sent to Pathology for evaluation.  I then evaluated her left axilla.  I could find no increased uptake of radioactive isotope in Ana axilla.  I made an incision through her previous scar in Ana axilla after anesthetized with Marcaine.  I took this down to Ana axillary tissue with electrocautery.  Again with Ana Neoprobe, I found no increased uptake of Ana radioactive isotope and found no blue dye in Ana axilla.  There was one large palpable lymph node, which I did excise with Ana cautery.  This was sent to Pathology as well.  Hemostasis  was appeared to be achieved in both incisions with Ana cautery.  I then anesthetized it further with Marcaine.  I then closed both Ana breast incision and axillary incision with interrupted 3- 0 Vicryl sutures and running 4-0 Monocryl sutures.  Skin glue was then applied.  Ana patient tolerated Ana procedure well.  All Ana counts were correct at Ana end of Ana procedure.  Ana patient was then extubated in Ana operating room and taken in stable condition to Ana recovery room.     Coralie Keens, M.D.     DB/MEDQ  D:  06/02/2015  T:  06/02/2015  Job:  [id number]

## 2015-06-02 NOTE — Interval H&P Note (Signed)
History and Physical Interval Note:no change in H and P  06/02/2015 9:08 AM  Ana Woodward  has presented today for surgery, with the diagnosis of Left breast cancer, back mass   The various methods of treatment have been discussed with the patient and family. After consideration of risks, benefits and other options for treatment, the patient has consented to  Procedure(s): LEFT BREAST LUMPECTOMY WITH RADIOACTIVE SEED AND SENTINEL LYMPH NODE BIOPSY (Left) EXCISION BACK  MASS (N/A) as a surgical intervention .  The patient's history has been reviewed, patient examined, no change in status, stable for surgery.  I have reviewed the patient's chart and labs.  Questions were answered to the patient's satisfaction.     [name] A

## 2015-06-02 NOTE — Anesthesia Preprocedure Evaluation (Addendum)
Anesthesia Evaluation  Patient identified by MRN, date of birth, ID band Patient awake    Reviewed: Allergy & Precautions, NPO status , Patient's Chart, lab work & pertinent test results  Airway Mallampati: II   Neck ROM: full    Dental   Pulmonary asthma ,    breath sounds clear to auscultation       Cardiovascular negative cardio ROS   Rhythm:regular Rate:Normal     Neuro/Psych    GI/Hepatic   Endo/Other  obese  Renal/GU      Musculoskeletal   Abdominal   Peds  Hematology   Anesthesia Other Findings   Reproductive/Obstetrics                            Anesthesia Physical Anesthesia Plan  ASA: II  Anesthesia Plan: General   Post-op Pain Management:    Induction: Intravenous  Airway Management Planned: LMA  Additional Equipment:   Intra-op Plan:   Post-operative Plan:   Informed Consent: I have reviewed the patients History and Physical, chart, labs and discussed the procedure including the risks, benefits and alternatives for the proposed anesthesia with the patient or authorized representative who has indicated his/her understanding and acceptance.     Plan Discussed with: CRNA, Anesthesiologist and Surgeon  Anesthesia Plan Comments: (Pt declined PECS block.)       Anesthesia Quick Evaluation

## 2015-06-02 NOTE — Anesthesia Procedure Notes (Signed)
Procedure Name: LMA Insertion Date/Time: 06/02/2015 10:11 AM Performed by: Hameed Kolar D Pre-anesthesia Checklist: Patient identified, Emergency Drugs available, Suction available and Patient being monitored Patient Re-evaluated:Patient Re-evaluated prior to inductionOxygen Delivery Method: Circle System Utilized Preoxygenation: Pre-oxygenation with 100% oxygen Intubation Type: IV induction Ventilation: Mask ventilation without difficulty LMA: LMA inserted LMA Size: 4.0 Number of attempts: 1 Airway Equipment and Method: Bite block Placement Confirmation: positive ETCO2 Tube secured with: Tape Dental Injury: Teeth and Oropharynx as per pre-operative assessment

## 2015-06-02 NOTE — Anesthesia Postprocedure Evaluation (Signed)
Anesthesia Post Note  Patient: Ana Woodward  Procedure(s) Performed: Procedure(s) (LRB): LEFT BREAST LUMPECTOMY WITH RADIOACTIVE SEED AND SENTINEL LYMPH NODE BIOPSY (Left) EXCISION BACK  MASS (N/A)  Patient location during evaluation: PACU Anesthesia Type: General Level of consciousness: awake and alert and patient cooperative Pain management: pain level controlled Vital Signs Assessment: post-procedure vital signs reviewed and stable Respiratory status: spontaneous breathing and respiratory function stable Cardiovascular status: stable Anesthetic complications: no    Last Vitals:  Filed Vitals:   06/02/15 1215 06/02/15 1230  BP: 121/69 127/72  Pulse: 78 77  Temp:    Resp: 13 14    Last Pain:  Filed Vitals:   06/02/15 1237  PainSc: [place]

## 2015-06-02 NOTE — Discharge Instructions (Signed)
[facility] °Office Phone Number [phone] ° °BREAST BIOPSY/ PARTIAL MASTECTOMY: POST OP INSTRUCTIONS ° °Always review your discharge instruction sheet given to you by the facility where your surgery was performed. ° °IF YOU HAVE DISABILITY OR FAMILY LEAVE FORMS, YOU MUST BRING THEM TO THE OFFICE FOR PROCESSING.  DO NOT GIVE THEM TO YOUR DOCTOR. ° °1. A prescription for pain medication may be given to you upon discharge.  Take your pain medication as prescribed, if needed.  If narcotic pain medicine is not needed, then you may take acetaminophen (Tylenol) or ibuprofen (Advil) as needed. °2. Take your usually prescribed medications unless otherwise directed °3. If you need a refill on your pain medication, please contact your pharmacy.  They will contact our office to request authorization.  Prescriptions will not be filled after 5pm or on week-ends. °4. You should eat very light the first 24 hours after surgery, such as soup, crackers, pudding, etc.  Resume your normal diet the day after surgery. °5. Most patients will experience some swelling and bruising in the breast.  Ice packs and a good support bra will help.  Swelling and bruising can take several days to resolve.  °6. It is common to experience some constipation if taking pain medication after surgery.  Increasing fluid intake and taking a stool softener will usually help or prevent this problem from occurring.  A mild laxative (Milk of Magnesia or Miralax) should be taken according to package directions if there are no bowel movements after 48 hours. °7. Unless discharge instructions indicate otherwise, you may remove your bandages 24-48 hours after surgery, and you may shower at that time.  You may have steri-strips (small skin tapes) in place directly over the incision.  These strips should be left on the skin for 7-10 days.  If your surgeon used skin glue on the incision, you may shower in 24 hours.  The glue will flake off over the  next 2-3 weeks.  Any sutures or staples will be removed at the office during your follow-up visit. °8. ACTIVITIES:  You may resume regular daily activities (gradually increasing) beginning the next day.  Wearing a good support bra or sports bra minimizes pain and swelling.  You may have sexual intercourse when it is comfortable. °a. You may drive when you no longer are taking prescription pain medication, you can comfortably wear a seatbelt, and you can safely maneuver your car and apply brakes. °b. RETURN TO WORK:  ______________________________________________________________________________________ °9. You should see your doctor in the office for a follow-up appointment approximately two weeks after your surgery.  Your doctor’s nurse will typically make your follow-up appointment when she calls you with your pathology report.  Expect your pathology report 2-3 business days after your surgery.  You may call to check if you do not hear from us after three days. °10. OTHER INSTRUCTIONS: _______________________________________________________________________________________________ _____________________________________________________________________________________________________________________________________ °_____________________________________________________________________________________________________________________________________ °_____________________________________________________________________________________________________________________________________ ° °WHEN TO CALL YOUR DOCTOR: °1. Fever over 101.0 °2. Nausea and/or vomiting. °3. Extreme swelling or bruising. °4. Continued bleeding from incision. °5. Increased pain, redness, or drainage from the incision. ° °The clinic staff is available to answer your questions during regular business hours.  Please don’t hesitate to call and ask to speak to one of the nurses for clinical concerns.  If you have a medical emergency, go to the nearest  emergency room or call 911.  A surgeon from [facility] is always on call at the hospital. ° °For further questions, please visit [email or web address]  ° ° ° °  Post Anesthesia Home Care Instructions ° °Activity: °Get plenty of rest for the remainder of the day. A responsible adult should stay with you for 24 hours following the procedure.  °For the next 24 hours, DO NOT: °-Drive a car °-Operate machinery °-Drink alcoholic beverages °-Take any medication unless instructed by your physician °-Make any legal decisions or sign important papers. ° °Meals: °Start with liquid foods such as gelatin or soup. Progress to regular foods as tolerated. Avoid greasy, spicy, heavy foods. If nausea and/or vomiting occur, drink only clear liquids until the nausea and/or vomiting subsides. Call your physician if vomiting continues. ° °Special Instructions/Symptoms: °Your throat may feel dry or sore from the anesthesia or the breathing tube placed in your throat during surgery. If this causes discomfort, gargle with warm salt water. The discomfort should disappear within 24 hours. ° °If you had a scopolamine patch placed behind your ear for the management of post- operative nausea and/or vomiting: ° °1. The medication in the patch is effective for 72 hours, after which it should be removed.  Wrap patch in a tissue and discard in the trash. Wash hands thoroughly with soap and water. °2. You may remove the patch earlier than 72 hours if you experience unpleasant side effects which may include dry mouth, dizziness or visual disturbances. °3. Avoid touching the patch. Wash your hands with soap and water after contact with the patch. °  ° °

## 2015-06-02 NOTE — Transfer of Care (Signed)
Immediate Anesthesia Transfer of Care Note  Patient: Ana Woodward  Procedure(s) Performed: Procedure(s): LEFT BREAST LUMPECTOMY WITH RADIOACTIVE SEED AND SENTINEL LYMPH NODE BIOPSY (Left) EXCISION BACK  MASS (N/A)  Patient Location: PACU  Anesthesia Type:General  Level of Consciousness: awake, alert , oriented and patient cooperative  Airway & Oxygen Therapy: Patient Spontanous Breathing and Patient connected to face mask oxygen  Post-op Assessment: Report given to RN and Post -op Vital signs reviewed and stable  Post vital signs: Reviewed and stable  Last Vitals:  Filed Vitals:   06/02/15 0928  BP: 112/63  Pulse: 67  Temp: 36.7 C  Resp: 20    Complications: No apparent anesthesia complications

## 2015-06-03 ENCOUNTER — Encounter (HOSPITAL_BASED_OUTPATIENT_CLINIC_OR_DEPARTMENT_OTHER): Payer: Self-pay | Admitting: Surgery

## 2015-06-08 NOTE — Assessment & Plan Note (Addendum)
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 Margins negative. One of one lymph node negative ER/PR 0%, HER-2 negative ratio 0.92, Ki-67 17%, T1b N0 stage IA (Oncotype DX score 29-risk of recurrence 72%, uncertain why Oncotype was done, ER negative breast cancer))  Pathology Review: I discussed the issue with margins along with discussion of tumor size.  Recommendation:  1. Reresection of the posterior margin 2. Systemic chemo with Abraxane weekly x 12 foll by Herceptin Q 3 weeks  Chemotherapy counseling: I discussed risks and benefits of chemotherapy including the cardiac toxicities of Herceptin as well as a neuropathy risk with Abraxane. We also discussed the role of blood work monitoring. Taste changes as well as hair loss or hair thinning was also discussed.  I will request a port placement, echocardiogram, chemotherapy education and plan to start chemotherapy 2 weeks after the reresection surgery.

## 2015-06-09 ENCOUNTER — Ambulatory Visit (HOSPITAL_BASED_OUTPATIENT_CLINIC_OR_DEPARTMENT_OTHER): Payer: Medicare Other | Admitting: Hematology and Oncology

## 2015-06-09 ENCOUNTER — Encounter: Payer: Self-pay | Admitting: Hematology and Oncology

## 2015-06-09 VITALS — BP 122/75 | HR 61 | Temp 97.7°F | Resp 18 | Ht 70.0 in | Wt 223.4 lb

## 2015-06-09 DIAGNOSIS — C50412 Malignant neoplasm of upper-outer quadrant of left female breast: Secondary | ICD-10-CM | POA: Diagnosis not present

## 2015-06-09 MED ORDER — ONDANSETRON HCL 8 MG PO TABS: 8.0000 mg | ORAL_TABLET | Freq: Two times a day (BID) | ORAL | Status: DC | PRN

## 2015-06-09 MED ORDER — LIDOCAINE-PRILOCAINE 2.5-2.5 % EX CREA: TOPICAL_CREAM | CUTANEOUS | Status: DC

## 2015-06-09 MED ORDER — PROCHLORPERAZINE MALEATE 10 MG PO TABS: 10.0000 mg | ORAL_TABLET | Freq: Four times a day (QID) | ORAL | Status: DC | PRN

## 2015-06-09 NOTE — Progress Notes (Signed)
Patient Care Team: Kathyrn Lass, MD as PCP - General (Family Medicine)  SUMMARY OF ONCOLOGIC HISTORY:   Breast cancer of upper-outer quadrant of left female breast ([place])   10/22/2008 Initial Diagnosis IDC with High-grade DCIS of the left breast ER/PR and HER-2 negative   11/06/2008 Surgery Left lumpectomy with sentinel node biopsy  invasive ductal carcinoma, grade 3, 0.5 cm. Margins negative. One of one lymph node negative ER/PR 0%, HER-2 negative ratio 0.92, Ki-67 17%, T1b N0 stage IA   03/15/2015 Mammogram new left breast loosely grouped pleomorphic calcifications measuring 3 cm , T2 N0 stage II a clinical stage   03/23/2015 Relapse/Recurrence Left breast biopsy: Invasive ductal carcinoma with DCIS with necrosis, grade 2, ER 100%, PR 0%, HER-2 positive ratio 3.34, Ki-67 80%   06/02/2015 Surgery left lumpectomy: Invasive ductal carcinoma 1.5 cm with DCIS, DCIS focally at posterior margin, 0/1 lymph nodes, T1c N0 stage IA, ER 100%, PR 0%, HER-2 positive ratio 3.34, Ki-67 80%    CHIEF COMPLIANT: Follow-up to discuss pathology report  INTERVAL HISTORY: Ana Woodward is a 70 year old fellow mentioned history of left breast cancer underwent lumpectomy and is here to discuss pathology report. She is recovering very well from the surgery.  REVIEW OF SYSTEMS:   Constitutional: Denies fevers, chills or abnormal weight loss Eyes: Denies blurriness of vision Ears, nose, mouth, throat, and face: Denies mucositis or sore throat Respiratory: Denies cough, dyspnea or wheezes Cardiovascular: Denies palpitation, chest discomfort Gastrointestinal:  Denies nausea, heartburn or change in bowel habits Skin: Denies abnormal skin rashes Lymphatics: Denies new lymphadenopathy or easy bruising Neurological:Denies numbness, tingling or new weaknesses Behavioral/Psych: Mood is stable, no new changes  Extremities: No lower extremity edema Breast: Left lumpectomy All other systems were reviewed with the patient  and are negative.  I have reviewed the past medical history, past surgical history, social history and family history with the patient and they are unchanged from previous note.  ALLERGIES:  is allergic to codeine and vicodin.  MEDICATIONS:  Current Outpatient Prescriptions  Medication Sig Dispense Refill  . BROCCOLI EXTRACT PO Take 500 mg by mouth daily.    [name] Cod Liver Oil 1000 MG CAPS Take by mouth.    . Coenzyme Q10 (COQ10) 100 MG CAPS Take 100 mg by mouth daily.    . IODINE, KELP, PO Take by mouth.    . loratadine (CLARITIN) 10 MG tablet Take 10 mg by mouth daily.    . Multiple Vitamins-Minerals (CENTRUM SILVER ADULT 50+ PO) Take by mouth.    . Turmeric 500 MG CAPS Take 500 mg by mouth daily.     No current facility-administered medications for this visit.    PHYSICAL EXAMINATION: ECOG PERFORMANCE STATUS: 1 - Symptomatic but completely ambulatory  Filed Vitals:   06/09/15 1126  BP: 122/75  Pulse: 61  Temp: 97.7 F (36.5 C)  Resp: 18   Filed Weights   06/09/15 1126  Weight: 223 lb 6.4 oz (101.334 kg)    GENERAL:alert, no distress and comfortable SKIN: skin color, texture, turgor are normal, no rashes or significant lesions EYES: normal, Conjunctiva are pink and non-injected, sclera clear OROPHARYNX:no exudate, no erythema and lips, buccal mucosa, and tongue normal  NECK: supple, thyroid normal size, non-tender, without nodularity LYMPH:  no palpable lymphadenopathy in the cervical, axillary or inguinal LUNGS: clear to auscultation and percussion with normal breathing effort HEART: regular rate & rhythm and no murmurs and no lower extremity edema ABDOMEN:abdomen soft, non-tender and normal bowel sounds  MUSCULOSKELETAL:no cyanosis of digits and no clubbing  NEURO: alert & oriented x 3 with fluent speech, no focal motor/sensory deficits EXTREMITIES: No lower extremity edema   LABORATORY DATA:  I have reviewed the data as listed   Chemistry      Component Value  Date/Time   NA 142 10/19/2009 1427   K 4.3 10/19/2009 1427   CL 109 10/19/2009 1427   CO2 20 10/19/2009 1427   BUN 13 10/19/2009 1427   CREATININE 0.82 10/19/2009 1427      Component Value Date/Time   CALCIUM 9.2 10/19/2009 1427   ALKPHOS 72 10/19/2009 1427   AST 19 10/19/2009 1427   ALT 17 10/19/2009 1427   BILITOT 0.5 10/19/2009 1427       Lab Results  Component Value Date   WBC 6.1 10/19/2009   HGB 13.1 10/19/2009   HCT 38.3 10/19/2009   MCV 92.3 10/19/2009   PLT 223 10/19/2009   NEUTROABS 3.8 10/19/2009   ASSESSMENT & PLAN:  Breast cancer of upper-outer quadrant of left female breast ([place]) left lumpectomy 06/02/15: Invasive ductal carcinoma 1.5 cm with DCIS, DCIS focally at posterior margin, 0/1 lymph nodes, T1c N0 stage IA, ER 100%, PR 0%, HER-2 positive ratio 3.34, Ki-67 80% (Left lumpectomy with sentinel node biopsy 10/27/2008 invasive ductal carcinoma, grade 3, 0.5 cm. Margins negative. One of one lymph node negative ER/PR 0%, HER-2 negative ratio 0.92, Ki-67 17%, T1b N0 stage IA (Oncotype DX score 29-risk of recurrence 45%, uncertain why Oncotype was done, ER negative breast cancer))  Pathology Review: I discussed the issue with margins along with discussion of tumor size.  Recommendation:  1. Reresection of the posterior margin 2. Systemic chemo with Abraxane weekly x 12 foll by Herceptin Q 3 weeks  Chemotherapy counseling: I discussed risks and benefits of chemotherapy including the cardiac toxicities of Herceptin as well as a neuropathy risk with Abraxane. We also discussed the role of blood work monitoring. Taste changes as well as hair loss or hair thinning was also discussed.  I will request a port placement, echocardiogram, chemotherapy education and plan to start chemotherapy 2 weeks after the reresection surgery.    No orders of the defined types were placed in this encounter.   The patient has a good understanding of the overall plan. she agrees with  it. she will call with any problems that may develop before the next visit here.   Rulon Eisenmenger, MD 06/09/2015

## 2015-06-10 ENCOUNTER — Telehealth (HOSPITAL_COMMUNITY): Payer: Self-pay | Admitting: Vascular Surgery

## 2015-06-10 ENCOUNTER — Other Ambulatory Visit: Payer: Self-pay | Admitting: *Deleted

## 2015-06-10 DIAGNOSIS — C50412 Malignant neoplasm of upper-outer quadrant of left female breast: Secondary | ICD-10-CM

## 2015-06-10 NOTE — Telephone Encounter (Signed)
Left pt message to make New brst cancer appt 

## 2015-06-11 ENCOUNTER — Encounter: Payer: Self-pay | Admitting: *Deleted

## 2015-06-11 ENCOUNTER — Telehealth: Payer: Self-pay | Admitting: Hematology and Oncology

## 2015-06-11 ENCOUNTER — Other Ambulatory Visit: Payer: Self-pay | Admitting: Surgery

## 2015-06-11 NOTE — Telephone Encounter (Signed)
lvm for pt regarding to March appt.... °

## 2015-06-15 ENCOUNTER — Encounter: Payer: Self-pay | Admitting: *Deleted

## 2015-06-15 ENCOUNTER — Other Ambulatory Visit: Payer: Medicare Other

## 2015-06-15 ENCOUNTER — Encounter: Payer: Self-pay | Admitting: Hematology and Oncology

## 2015-06-15 NOTE — Progress Notes (Signed)
Left msg for pt to return my call to discuss copay assistance for her treatment and go over the [employer].

## 2015-06-15 NOTE — Progress Notes (Signed)
Spoke w/ pt regarding copay assistance.  Informed pt of the [employer] to assist with her out of pocket for treatment.  She has my card to call me with her and her husband's yearly income to see if she qualifies for the grant as well as for the [employer].

## 2015-06-16 ENCOUNTER — Encounter (HOSPITAL_BASED_OUTPATIENT_CLINIC_OR_DEPARTMENT_OTHER): Payer: Self-pay | Admitting: *Deleted

## 2015-06-16 ENCOUNTER — Telehealth: Payer: Self-pay | Admitting: Hematology and Oncology

## 2015-06-16 NOTE — Telephone Encounter (Signed)
s.w. pt and advised on March appt....pt ok and aware °

## 2015-06-22 ENCOUNTER — Encounter: Payer: Self-pay | Admitting: *Deleted

## 2015-06-23 ENCOUNTER — Ambulatory Visit (HOSPITAL_COMMUNITY): Admission: RE | Admit: 2015-06-23 | Payer: Medicare Other | Source: Ambulatory Visit

## 2015-06-23 ENCOUNTER — Inpatient Hospital Stay (HOSPITAL_COMMUNITY): Admission: RE | Admit: 2015-06-23 | Payer: Medicare Other | Source: Ambulatory Visit | Admitting: Internal Medicine

## 2015-06-23 ENCOUNTER — Encounter (HOSPITAL_BASED_OUTPATIENT_CLINIC_OR_DEPARTMENT_OTHER): Payer: Self-pay | Admitting: *Deleted

## 2015-06-23 ENCOUNTER — Telehealth: Payer: Self-pay | Admitting: Hematology and Oncology

## 2015-06-23 NOTE — Telephone Encounter (Signed)
Left vm to ingotm patient of 3/22 appt change to 3/23 per 3/14 pof

## 2015-06-24 ENCOUNTER — Telehealth: Payer: Self-pay | Admitting: *Deleted

## 2015-06-24 NOTE — Telephone Encounter (Signed)
Left vm for pt to return call concerning treatment care plan. Contact information provided.

## 2015-06-25 ENCOUNTER — Telehealth: Payer: Self-pay | Admitting: Hematology and Oncology

## 2015-06-25 ENCOUNTER — Other Ambulatory Visit: Payer: Self-pay | Admitting: *Deleted

## 2015-06-25 DIAGNOSIS — C50412 Malignant neoplasm of upper-outer quadrant of left female breast: Secondary | ICD-10-CM

## 2015-06-25 NOTE — Telephone Encounter (Signed)
Moved rx appt 3/23 to 4/4 after la/ov visit per 3/17 pof. Left vm to inform patient of new date/time

## 2015-06-28 ENCOUNTER — Encounter (HOSPITAL_COMMUNITY): Payer: Self-pay

## 2015-06-28 ENCOUNTER — Ambulatory Visit (HOSPITAL_COMMUNITY)
Admission: RE | Admit: 2015-06-28 | Discharge: 2015-06-28 | Disposition: A | Discharge status: Home / Self Care | Payer: Medicare Other | Source: Ambulatory Visit | Attending: Hematology and Oncology | Admitting: Hematology and Oncology

## 2015-06-28 ENCOUNTER — Ambulatory Visit (HOSPITAL_BASED_OUTPATIENT_CLINIC_OR_DEPARTMENT_OTHER)
Admission: RE | Admit: 2015-06-28 | Discharge: 2015-06-28 | Disposition: A | Discharge status: Home / Self Care | Payer: Medicare Other | Source: Ambulatory Visit | Attending: Internal Medicine | Admitting: Internal Medicine

## 2015-06-28 VITALS — BP 126/80 | HR 69 | Wt 218.2 lb

## 2015-06-28 DIAGNOSIS — I517 Cardiomegaly: Secondary | ICD-10-CM | POA: Diagnosis not present

## 2015-06-28 DIAGNOSIS — C50412 Malignant neoplasm of upper-outer quadrant of left female breast: Secondary | ICD-10-CM

## 2015-06-28 DIAGNOSIS — I5189 Other ill-defined heart diseases: Secondary | ICD-10-CM | POA: Insufficient documentation

## 2015-06-28 DIAGNOSIS — Z01810 Encounter for preprocedural cardiovascular examination: Secondary | ICD-10-CM | POA: Diagnosis not present

## 2015-06-28 NOTE — Patient Instructions (Signed)
Your physician recommends that you schedule a follow-up appointment in: July with an echocardiogram

## 2015-06-28 NOTE — Progress Notes (Signed)
  Echocardiogram 2D Echocardiogram has been performed.  Jennette Dubin 06/28/2015, 1:53 PM

## 2015-06-29 NOTE — H&P (Signed)
Ana Woodward is an 70 y.o. female.   Chief Complaint: breast cancer HPI: 70 year old female with left breast cancer s/p lumpectomy with positive margins.  She now presents for re-excision of the lumpectomy site and port a cath insertion for chemotherapy.  She has no complaints  Past Medical History  Diagnosis Date  . History of breast cancer   . Seasonal allergies   . H/O colonoscopy 2016  . H/O bone density study 2010  . Pap smear for cervical cancer screening 2010  . Radiation 02/01/09-03/22/09    left breast 5040 cGy  . Snores   . Asthma     as child, no problems now  . Breast cancer ([place])     left breast cancer 2010-lumpectomy, radiation    Past Surgical History  Procedure Laterality Date  . Breast lumpectomy  11/06/2008    left with sentinel node biopsy  . Eye surgery  10/13/2011, 11/10/2011    right and left  . Hernia repair Right 1959  . Breast biopsy Left 03/23/15  . Breast lumpectomy with radioactive seed and sentinel lymph node biopsy Left 06/02/2015    Procedure: LEFT BREAST LUMPECTOMY WITH RADIOACTIVE SEED AND SENTINEL LYMPH NODE BIOPSY;  Surgeon: Coralie Keens, MD;  Location: [place];  Service: General;  Laterality: Left;  [name] Mass excision N/A 06/02/2015    Procedure: EXCISION BACK  MASS;  Surgeon: Coralie Keens, MD;  Location: [place];  Service: General;  Laterality: N/A;    Family History  Problem Relation Age of Onset  . Heart murmur Mother   . Diabetes type II Mother   . Kidney cancer Mother   . Heart disease Mother   . Cancer Mother     tumor on right kidney  . Breast cancer Paternal Aunt   . Cancer Paternal Aunt     breast  . Colon cancer Maternal Uncle    Social History:  reports that she has never smoked. She has never used smokeless tobacco. She reports that she does not drink alcohol or use illicit drugs.  Allergies:  Allergies  Allergen Reactions  . Codeine Nausea And Vomiting  . Vicodin  [Hydrocodone-Acetaminophen] Nausea And Vomiting    No prescriptions prior to admission    No results found for this or any previous visit (from the past 48 hour(s)). No results found.  Review of Systems  All other systems reviewed and are negative.   Height 5\' 10"  (1.778 m), weight 101.152 kg (223 lb). Physical Exam  Constitutional: She is oriented to person, place, and time. She appears well-developed and well-nourished. No distress.  HENT:  Head: Normocephalic and atraumatic.  Right Ear: External ear normal.  Left Ear: External ear normal.  Nose: Nose normal.  Mouth/Throat: Oropharynx is clear and moist.  Eyes: Conjunctivae are normal. Pupils are equal, round, and reactive to light.  Neck: Normal range of motion. Neck supple. No tracheal deviation present.  Cardiovascular: Normal rate, regular rhythm and normal heart sounds.   Respiratory: Effort normal and breath sounds normal.  GI: Soft. Bowel sounds are normal. She exhibits no distension. There is no tenderness.  Musculoskeletal: Normal range of motion.  Neurological: She is alert and oriented to person, place, and time.  Skin: Skin is warm. She is not diaphoretic. No erythema.  Psychiatric: Her behavior is normal. Judgment normal.     Assessment/Plan Left breast cancer  Will proceed to the OR for re-excision of the left breast lumpectomy site to achieve  negative margins as well as port-a-cath insertion for IV chemotherapy.  I discussed the risk which include but are not limited to bleeding, infection, injury to surrounding structures, pneumothorax, need for further surgery, etc.  She agrees to proceed.  Harl Bowie, MD 06/29/2015, 8:19 PM

## 2015-06-29 NOTE — Progress Notes (Signed)
Patient ID: Ana Woodward, female   DOB: 11-13-1945, 70 y.o.   MRN: [id number] Oncologist: Dr Lindi Adie  70 yo with history of breast cancer presents for cardio-oncology clinic evaluation. Initial episode of breast cancer was on the left in 2010, treated with left lumpectomy.  In 12/16, she had a recurrence on the left.  She had left lumpectomy in 2/17.  This showed EF+/PR-/HER2+ tumor. Plan will be for Abraxane weekly x 12 cycles then Herceptin 3 months x 1 year.   She is doing well symptomatically.  No chest pain, no exertional dyspnea.  No history of heart disease.   I reviewed her echo today: this showed EF 55-60% with GLS -20.9%.  PMH: 1. Breast cancer: Initial episode on left in 2010, treated with left lumpectomy.  In 12/16, she had a recurrence on the left.  She had left lumpectomy in 2/17.  This showed EF+/PR-/HER2+ tumor. Plan will be for Abraxane weekly x 12 cycles then Herceptin 3 months x 1 year.  - Echo (3/17) with EF 55-60%, GLS -20.9%.  2. Hernia repair.  SH: Nonsmoker, used to work in [employer] school system.    FH: Mother with atrial fibrillation, grandfather with MI.   ROS: All systems reviewed and negative except as per HPI.   Current Outpatient Prescriptions  Medication Sig Dispense Refill  . BROCCOLI EXTRACT PO Take 500 mg by mouth daily.    [name] Cod Liver Oil 1000 MG CAPS Take by mouth.    . Coenzyme Q10 (COQ10) 100 MG CAPS Take 100 mg by mouth daily.    . IODINE, KELP, PO Take by mouth.    . loratadine (CLARITIN) 10 MG tablet Take 10 mg by mouth daily.    . Turmeric 500 MG CAPS Take 500 mg by mouth daily.    [name] lidocaine-prilocaine (EMLA) cream Apply to affected area once (Patient not taking: Reported on 06/28/2015) 30 g 3  . ondansetron (ZOFRAN) 8 MG tablet Take 1 tablet (8 mg total) by mouth 2 (two) times daily as needed (Nausea or vomiting). (Patient not taking: Reported on 06/28/2015) 30 tablet 1  . prochlorperazine (COMPAZINE) 10 MG tablet Take 1 tablet (10 mg  total) by mouth every 6 (six) hours as needed (Nausea or vomiting). (Patient not taking: Reported on 06/28/2015) 30 tablet 1   No current facility-administered medications for this encounter.   BP 126/80 mmHg  Pulse 69  Wt 218 lb 4 oz (98.998 kg)  SpO2 99% General: NAD Neck: No JVD, no thyromegaly or thyroid nodule.  Lungs: Clear to auscultation bilaterally with normal respiratory effort. CV: Nondisplaced PMI.  Heart regular S1/S2, no S3/S4, no murmur.  No peripheral edema.  No carotid bruit.  Normal pedal pulses.  Abdomen: Soft, nontender, no hepatosplenomegaly, no distention.  Skin: Intact without lesions or rashes.  Neurologic: Alert and oriented x 3.  Psych: Normal affect. Extremities: No clubbing or cyanosis.  HEENT: Normal.   Assessment/Plan: We discussed the purpose of cardio-oncology clinic today. She will be getting Herceptin for a year, with therapy starting in early April.  She will have approximately a 10% risk of developing worsening cardiac function while taking Herceptin.  We will follow LV function by echo every 3 months while on Herceptin, I will schedule her for echo and followup in 7/17.   Loralie Champagne 06/29/2015

## 2015-06-30 ENCOUNTER — Encounter (HOSPITAL_BASED_OUTPATIENT_CLINIC_OR_DEPARTMENT_OTHER): Payer: Self-pay | Admitting: Certified Registered"

## 2015-06-30 ENCOUNTER — Ambulatory Visit (HOSPITAL_COMMUNITY): Payer: Medicare Other

## 2015-06-30 ENCOUNTER — Ambulatory Visit (HOSPITAL_BASED_OUTPATIENT_CLINIC_OR_DEPARTMENT_OTHER): Payer: Medicare Other | Admitting: Anesthesiology

## 2015-06-30 ENCOUNTER — Ambulatory Visit: Payer: Medicare Other | Admitting: Hematology and Oncology

## 2015-06-30 ENCOUNTER — Ambulatory Visit: Payer: Medicare Other

## 2015-06-30 ENCOUNTER — Ambulatory Visit (HOSPITAL_BASED_OUTPATIENT_CLINIC_OR_DEPARTMENT_OTHER)
Admission: RE | Admit: 2015-06-30 | Discharge: 2015-06-30 | Disposition: A | Discharge status: Home / Self Care | Payer: Medicare Other | Source: Ambulatory Visit | Attending: Surgery | Admitting: Surgery

## 2015-06-30 ENCOUNTER — Encounter (HOSPITAL_BASED_OUTPATIENT_CLINIC_OR_DEPARTMENT_OTHER)
Admission: RE | Disposition: A | Discharge status: Home / Self Care | Payer: Self-pay | Source: Ambulatory Visit | Attending: Surgery

## 2015-06-30 ENCOUNTER — Other Ambulatory Visit: Payer: Medicare Other

## 2015-06-30 DIAGNOSIS — C50912 Malignant neoplasm of unspecified site of left female breast: Secondary | ICD-10-CM | POA: Diagnosis present

## 2015-06-30 DIAGNOSIS — Z923 Personal history of irradiation: Secondary | ICD-10-CM | POA: Insufficient documentation

## 2015-06-30 DIAGNOSIS — Z419 Encounter for procedure for purposes other than remedying health state, unspecified: Secondary | ICD-10-CM

## 2015-06-30 DIAGNOSIS — Z95828 Presence of other vascular implants and grafts: Secondary | ICD-10-CM

## 2015-06-30 HISTORY — PX: RE-EXCISION OF BREAST LUMPECTOMY: SHX6048

## 2015-06-30 HISTORY — PX: PORTACATH PLACEMENT: SHX2246

## 2015-06-30 SURGERY — INSERTION, TUNNELED CENTRAL VENOUS DEVICE, WITH PORT
Anesthesia: General | Site: Chest | Laterality: Right

## 2015-06-30 MED ORDER — LACTATED RINGERS IV SOLN
INTRAVENOUS | Status: DC
  Administered 2015-06-30 (×2): via INTRAVENOUS

## 2015-06-30 MED ORDER — PROPOFOL 500 MG/50ML IV EMUL
INTRAVENOUS | Status: AC
  Filled 2015-06-30: qty 50

## 2015-06-30 MED ORDER — LIDOCAINE HCL (CARDIAC) 20 MG/ML IV SOLN
INTRAVENOUS | Status: AC
  Filled 2015-06-30: qty 5

## 2015-06-30 MED ORDER — SODIUM BICARBONATE 4 % IV SOLN
INTRAVENOUS | Status: AC
  Filled 2015-06-30: qty 5

## 2015-06-30 MED ORDER — DEXAMETHASONE SODIUM PHOSPHATE 4 MG/ML IJ SOLN
INTRAMUSCULAR | Status: DC | PRN
  Administered 2015-06-30: 10 mg via INTRAVENOUS

## 2015-06-30 MED ORDER — SCOPOLAMINE 1 MG/3DAYS TD PT72: 1.0000 | MEDICATED_PATCH | Freq: Once | TRANSDERMAL | Status: DC | PRN

## 2015-06-30 MED ORDER — SODIUM CHLORIDE 0.9% FLUSH: 3.0000 mL | Freq: Two times a day (BID) | INTRAVENOUS | Status: DC

## 2015-06-30 MED ORDER — FENTANYL CITRATE (PF) 100 MCG/2ML IJ SOLN: 25.0000 ug | INTRAMUSCULAR | Status: DC | PRN

## 2015-06-30 MED ORDER — TRAMADOL HCL 50 MG PO TABS: 50.0000 mg | ORAL_TABLET | Freq: Four times a day (QID) | ORAL | Status: AC | PRN

## 2015-06-30 MED ORDER — LIDOCAINE HCL (PF) 1 % IJ SOLN
INTRAMUSCULAR | Status: AC
  Filled 2015-06-30: qty 30

## 2015-06-30 MED ORDER — BUPIVACAINE-EPINEPHRINE (PF) 0.25% -1:200000 IJ SOLN
INTRAMUSCULAR | Status: AC
  Filled 2015-06-30: qty 30

## 2015-06-30 MED ORDER — PROMETHAZINE HCL 25 MG/ML IJ SOLN: 6.2500 mg | INTRAMUSCULAR | Status: DC | PRN

## 2015-06-30 MED ORDER — HEPARIN SOD (PORK) LOCK FLUSH 100 UNIT/ML IV SOLN
INTRAVENOUS | Status: AC
  Filled 2015-06-30: qty 5

## 2015-06-30 MED ORDER — HEPARIN SOD (PORK) LOCK FLUSH 100 UNIT/ML IV SOLN
INTRAVENOUS | Status: DC | PRN
  Administered 2015-06-30: 500 [IU] via INTRAVENOUS

## 2015-06-30 MED ORDER — PROPOFOL 10 MG/ML IV BOLUS
INTRAVENOUS | Status: DC | PRN
  Administered 2015-06-30: 150 mg via INTRAVENOUS

## 2015-06-30 MED ORDER — DEXAMETHASONE SODIUM PHOSPHATE 10 MG/ML IJ SOLN
INTRAMUSCULAR | Status: AC
  Filled 2015-06-30: qty 1

## 2015-06-30 MED ORDER — LIDOCAINE HCL (CARDIAC) 20 MG/ML IV SOLN
INTRAVENOUS | Status: DC | PRN
  Administered 2015-06-30: 60 mg via INTRAVENOUS

## 2015-06-30 MED ORDER — BUPIVACAINE-EPINEPHRINE (PF) 0.5% -1:200000 IJ SOLN
INTRAMUSCULAR | Status: DC | PRN
  Administered 2015-06-30: 24 mL

## 2015-06-30 MED ORDER — ACETAMINOPHEN 325 MG PO TABS: 650.0000 mg | ORAL_TABLET | ORAL | Status: DC | PRN

## 2015-06-30 MED ORDER — FENTANYL CITRATE (PF) 100 MCG/2ML IJ SOLN
50.0000 ug | INTRAMUSCULAR | Status: DC | PRN
Start: 2015-06-30 — End: 2015-06-30
  Administered 2015-06-30: 50 ug via INTRAVENOUS

## 2015-06-30 MED ORDER — MIDAZOLAM HCL 2 MG/2ML IJ SOLN
1.0000 mg | INTRAMUSCULAR | Status: DC | PRN
  Administered 2015-06-30: 1 mg via INTRAVENOUS

## 2015-06-30 MED ORDER — SODIUM CHLORIDE 0.9% FLUSH: 3.0000 mL | INTRAVENOUS | Status: DC | PRN

## 2015-06-30 MED ORDER — ONDANSETRON HCL 4 MG/2ML IJ SOLN
INTRAMUSCULAR | Status: AC
  Filled 2015-06-30: qty 2

## 2015-06-30 MED ORDER — ONDANSETRON HCL 4 MG/2ML IJ SOLN
INTRAMUSCULAR | Status: DC | PRN
  Administered 2015-06-30: 4 mg via INTRAVENOUS

## 2015-06-30 MED ORDER — SODIUM CHLORIDE 0.9 % IV SOLN: 250.0000 mL | INTRAVENOUS | Status: DC | PRN

## 2015-06-30 MED ORDER — FENTANYL CITRATE (PF) 100 MCG/2ML IJ SOLN
INTRAMUSCULAR | Status: AC
  Filled 2015-06-30: qty 2

## 2015-06-30 MED ORDER — CEFAZOLIN SODIUM-DEXTROSE 2-3 GM-% IV SOLR
INTRAVENOUS | Status: AC
  Filled 2015-06-30: qty 50

## 2015-06-30 MED ORDER — BUPIVACAINE-EPINEPHRINE (PF) 0.5% -1:200000 IJ SOLN
INTRAMUSCULAR | Status: AC
  Filled 2015-06-30: qty 30

## 2015-06-30 MED ORDER — GLYCOPYRROLATE 0.2 MG/ML IJ SOLN: 0.2000 mg | Freq: Once | INTRAMUSCULAR | Status: DC | PRN

## 2015-06-30 MED ORDER — OXYCODONE HCL 5 MG PO TABS: 5.0000 mg | ORAL_TABLET | ORAL | Status: DC | PRN

## 2015-06-30 MED ORDER — CEFAZOLIN SODIUM-DEXTROSE 2-3 GM-% IV SOLR
2.0000 g | INTRAVENOUS | Status: AC
  Administered 2015-06-30: 2 g via INTRAVENOUS

## 2015-06-30 MED ORDER — MIDAZOLAM HCL 2 MG/2ML IJ SOLN
INTRAMUSCULAR | Status: AC
  Filled 2015-06-30: qty 2

## 2015-06-30 MED ORDER — HEPARIN (PORCINE) IN NACL 2-0.9 UNIT/ML-% IJ SOLN
INTRAMUSCULAR | Status: DC | PRN
  Administered 2015-06-30: 1 via INTRAVENOUS

## 2015-06-30 MED ORDER — ACETAMINOPHEN 650 MG RE SUPP: 650.0000 mg | RECTAL | Status: DC | PRN

## 2015-06-30 MED ORDER — HEPARIN (PORCINE) IN NACL 2-0.9 UNIT/ML-% IJ SOLN
INTRAMUSCULAR | Status: AC
  Filled 2015-06-30: qty 500

## 2015-06-30 SURGICAL SUPPLY — 49 items
BAG DECANTER FOR FLEXI CONT (MISCELLANEOUS) ×4 IMPLANT
BLADE HEX COATED 2.75 (ELECTRODE) ×4 IMPLANT
BLADE SURG 15 STRL LF DISP TIS (BLADE) ×2 IMPLANT
BLADE SURG 15 STRL SS (BLADE) ×4
CANISTER SUCT 1200ML W/VALVE (MISCELLANEOUS) ×4 IMPLANT
CHLORAPREP W/TINT 26ML (MISCELLANEOUS) ×4 IMPLANT
CLIP TI WIDE RED SMALL 6 (CLIP) IMPLANT
COVER BACK TABLE 60X90IN (DRAPES) ×4 IMPLANT
COVER MAYO STAND STRL (DRAPES) ×4 IMPLANT
DECANTER SPIKE VIAL GLASS SM (MISCELLANEOUS) IMPLANT
DEVICE DUBIN W/COMP PLATE 8390 (MISCELLANEOUS) IMPLANT
DRAPE C-ARM 42X72 X-RAY (DRAPES) ×4 IMPLANT
DRAPE LAPAROSCOPIC ABDOMINAL (DRAPES) ×4 IMPLANT
DRAPE LAPAROTOMY 100X72 PEDS (DRAPES) ×4 IMPLANT
DRAPE UTILITY XL STRL (DRAPES) ×4 IMPLANT
ELECT REM PT RETURN 9FT ADLT (ELECTROSURGICAL) ×4
ELECTRODE REM PT RTRN 9FT ADLT (ELECTROSURGICAL) ×2 IMPLANT
GAUZE SPONGE 4X4 12PLY STRL (GAUZE/BANDAGES/DRESSINGS) ×4 IMPLANT
GLOVE BIO SURGEON STRL SZ7 (GLOVE) ×4 IMPLANT
GLOVE BIO SURGEON STRL SZ7.5 (GLOVE) ×4 IMPLANT
GLOVE SURG SIGNA 7.5 PF LTX (GLOVE) ×4 IMPLANT
GOWN STRL REUS W/ TWL LRG LVL3 (GOWN DISPOSABLE) ×2 IMPLANT
GOWN STRL REUS W/ TWL XL LVL3 (GOWN DISPOSABLE) ×2 IMPLANT
GOWN STRL REUS W/TWL LRG LVL3 (GOWN DISPOSABLE) ×4
GOWN STRL REUS W/TWL XL LVL3 (GOWN DISPOSABLE) ×4
IV KIT MINILOC 20X1 SAFETY (NEEDLE) IMPLANT
KIT MARKER MARGIN INK (KITS) IMPLANT
KIT PORT POWER 8FR ISP CVUE (Catheter) ×2 IMPLANT
LIQUID BAND (GAUZE/BANDAGES/DRESSINGS) ×8 IMPLANT
NDL HYPO 25X1 1.5 SAFETY (NEEDLE) ×2 IMPLANT
NEEDLE HYPO 25X1 1.5 SAFETY (NEEDLE) ×4 IMPLANT
NS IRRIG 1000ML POUR BTL (IV SOLUTION) IMPLANT
PACK BASIN DAY SURGERY FS (CUSTOM PROCEDURE TRAY) ×4 IMPLANT
PENCIL BUTTON HOLSTER BLD 10FT (ELECTRODE) ×4 IMPLANT
SLEEVE SCD COMPRESS KNEE MED (MISCELLANEOUS) ×4 IMPLANT
SPONGE LAP 4X18 X RAY DECT (DISPOSABLE) ×4 IMPLANT
SUT MNCRL AB 4-0 PS2 18 (SUTURE) ×4 IMPLANT
SUT PROLENE 2 0 SH DA (SUTURE) ×4 IMPLANT
SUT SILK 2 0 SH (SUTURE) ×4 IMPLANT
SUT SILK 2 0 TIES 17X18 (SUTURE)
SUT SILK 2-0 18XBRD TIE BLK (SUTURE) IMPLANT
SUT VIC AB 3-0 SH 27 (SUTURE) ×4
SUT VIC AB 3-0 SH 27X BRD (SUTURE) ×2 IMPLANT
SYR CONTROL 10ML LL (SYRINGE) ×4 IMPLANT
TOWEL OR 17X24 6PK STRL BLUE (TOWEL DISPOSABLE) ×4 IMPLANT
TOWEL OR NON WOVEN STRL DISP B (DISPOSABLE) ×4 IMPLANT
TUBE CONNECTING 20'X1/4 (TUBING) ×1
TUBE CONNECTING 20X1/4 (TUBING) ×1 IMPLANT
YANKAUER SUCT BULB TIP NO VENT (SUCTIONS) ×4 IMPLANT

## 2015-06-30 NOTE — Anesthesia Postprocedure Evaluation (Signed)
Anesthesia Post Note  Patient: Ana Woodward  Procedure(s) Performed: Procedure(s) (LRB): INSERTION PORT-A-CATH (Right) RE-EXCISION OFLEFT BREAST CANCER (Left)  Patient location during evaluation: PACU Anesthesia Type: General Level of consciousness: awake and alert Pain management: pain level controlled Vital Signs Assessment: post-procedure vital signs reviewed and stable Respiratory status: spontaneous breathing, nonlabored ventilation, respiratory function stable and patient connected to nasal cannula oxygen Cardiovascular status: blood pressure returned to baseline and stable Postop Assessment: no signs of nausea or vomiting Anesthetic complications: no    Last Vitals:  Filed Vitals:   06/30/15 0930 06/30/15 0945  BP: 121/67 106/71  Pulse: 87 87  Temp:    Resp: 15 18    Last Pain:  Filed Vitals:   06/30/15 0947  PainSc: 2                  Zenaida Deed

## 2015-06-30 NOTE — Interval H&P Note (Signed)
History and Physical Interval Note:no change in H and P  06/30/2015 7:37 AM  Ana Woodward  has presented today for surgery, with the diagnosis of LEFT BREAST CANCER  The various methods of treatment have been discussed with the patient and family. After consideration of risks, benefits and other options for treatment, the patient has consented to  Procedure(s): INSERTION PORT-A-CATH (N/A) RE-EXCISION OFLEFT BREAST CANCER (Left) as a surgical intervention .  The patient's history has been reviewed, patient examined, no change in status, stable for surgery.  I have reviewed the patient's chart and labs.  Questions were answered to the patient's satisfaction.     Jacobo Moncrief A

## 2015-06-30 NOTE — Transfer of Care (Signed)
Immediate Anesthesia Transfer of Care Note  Patient: Ana Woodward  Procedure(s) Performed: Procedure(s): INSERTION PORT-A-CATH (Right) RE-EXCISION OFLEFT BREAST CANCER (Left)  Patient Location: PACU  Anesthesia Type:General  Level of Consciousness: awake, alert , oriented and patient cooperative  Airway & Oxygen Therapy: Patient Spontanous Breathing and Patient connected to face mask oxygen  Post-op Assessment: Report given to RN and Post -op Vital signs reviewed and stable  Post vital signs: Reviewed and stable  Last Vitals:  Filed Vitals:   06/30/15 0732  BP: 119/72  Pulse: 81  Temp: 36.8 C  Resp: 18    Complications: No apparent anesthesia complications

## 2015-06-30 NOTE — Op Note (Signed)
INSERTION PORT-A-CATH, RE-EXCISION OFLEFT BREAST CANCER  Procedure Note  Ana Woodward 06/30/2015   Pre-op Diagnosis: LEFT BREAST CANCER     Post-op Diagnosis: dame  Procedure(s): INSERTION PORT-A-CATH (8 fr in right subclavian vein) RE-EXCISION OFLEFT BREAST CANCER  Surgeon(s): Coralie Keens, MD  Anesthesia: General  Staff:  Circulator: Vara Guardian, RN Radiology Technologist: Ludger Nutting Scrub Person: Antionette Poles, RN  Estimated Blood Loss: Minimal               Specimens: posterior/inferior margin sent to path          [facility] A   Date: 06/30/2015  Time: 8:54 AM

## 2015-06-30 NOTE — Op Note (Signed)
Ana, Woodward               ACCOUNT NO.:  [id number]  MEDICAL RECORD NO.:  [id number]  LOCATION:                                 FACILITY:  PHYSICIAN:  Coralie Keens, M.D. DATE OF BIRTH:  May 24, 1945  DATE OF PROCEDURE:  06/30/2015 DATE OF DISCHARGE:                              OPERATIVE REPORT   PREOPERATIVE DIAGNOSIS:  Left breast cancer.  POSTOPERATIVE DIAGNOSIS:  Left breast cancer.  PROCEDURE: 1. Right subclavian Port-A-Cath insertion. 2. Re-excision of left breast cancer.  SURGEON:  Coralie Keens, M.D.  ANESTHESIA:  General and 0.5% Marcaine.  ESTIMATED BLOOD LOSS:  Minimal.  INDICATIONS:  A 70 year old female, who has undergone a left breast lumpectomy for recurrent breast cancer.  She has had previous lumpectomy and radiation therapy.  She refused a mastectomy.  She agreed only to lumpectomy and sentinel node biopsy.  She also refused Port-A-Cath insertion at that time.  The margins showed a focally positive posterior margin.  Decision was made to proceed with re-excision and she then agreed to Port-A-Cath insertion.  PROCEDURE IN DETAIL:  The patient was brought to the operating room, identified as Ana Woodward.  She was placed supine on the operating table and general anesthesia was induced.  Her breasts and chest were then prepped and draped in usual sterile fashion.  With the patient in Trendelenburg position, I anesthetized the right chest and clavicle.  I then used the introducer needle to easily cannulate the right subclavian vein.  A wire was then passed through the needle into central venous system under direct fluoroscopy.  I anesthetized the chest further and then made a longitudinal incision at the site of the wire.  I then created a port for the Port-A-Cath with the electrocautery.  An 8-French __________ port was then brought onto the field.  I cut the catheter at appropriate length and attached it to the port.  I then fed the  venous dilator introducer down the wire and into central venous system.  The wire and dilator were then removed.  The catheter was then fed down the peel-away sheath and into the central venous system.  The sheath was peeled away.  Fluoroscopy then confirmed that there was adequate placement in the superior vena cava.  I then accessed the port and good flush and return were demonstrated.  I then sewed the port to the chest wall with 2 separate 3-0 Prolene sutures.  I again accessed the port and instilled concentrated heparin solution into the port.  The subcutaneous tissue was then closed with interrupted 3-0 Vicryl sutures.  The skin was closed with a running 4-0 Monocryl.  I then anesthetized the skin at the lumpectomy site of the left breast. I opened up the incision with a scalpel and then suctioned out all the seroma.  I then excised the posterior margin including the inferior margin as well with the electrocautery.  I could not take any anterior margin as there was only skin above this area.  The posterior margin was taken all the way down to the pectoralis fascia.  This was sent to Pathology for evaluation.  I then anesthetized the skin further with Marcaine.  Hemostasis appeared  to be achieved with cautery.  I then closed the subcutaneous tissue with interrupted 3-0 Vicryl sutures and closed the skin with a running 4-0 Monocryl.  Skin glue was then applied.  The patient tolerated the procedure well.  All the counts were correct at the end of procedure.  The patient was then extubated in the operating room and taken in stable condition to recovery room.     Coralie Keens, M.D.     DB/MEDQ  D:  06/30/2015  T:  06/30/2015  Job:  [id number]

## 2015-06-30 NOTE — Anesthesia Procedure Notes (Signed)
Procedure Name: LMA Insertion Date/Time: 06/30/2015 8:08 AM Performed by: Shenay Torti D Pre-anesthesia Checklist: Patient identified, Emergency Drugs available, Suction available and Patient being monitored Patient Re-evaluated:Patient Re-evaluated prior to inductionOxygen Delivery Method: Circle System Utilized Preoxygenation: Pre-oxygenation with 100% oxygen Intubation Type: IV induction Ventilation: Mask ventilation without difficulty LMA: LMA inserted LMA Size: 4.0 Number of attempts: 1 Airway Equipment and Method: Bite block Placement Confirmation: positive ETCO2 Tube secured with: Tape Dental Injury: Teeth and Oropharynx as per pre-operative assessment

## 2015-06-30 NOTE — Anesthesia Preprocedure Evaluation (Addendum)
Anesthesia Evaluation  Patient identified by MRN, date of birth, ID band Patient awake    Reviewed: Allergy & Precautions, NPO status , Patient's Chart, lab work & pertinent test results  Airway Mallampati: II  TM Distance: >3 FB Neck ROM: Full    Dental no notable dental hx. (+) Teeth Intact   Pulmonary asthma ,  Asthma as a child   Pulmonary exam normal breath sounds clear to auscultation       Cardiovascular  Rhythm:Regular Rate:Normal     Neuro/Psych    GI/Hepatic   Endo/Other  obese  Renal/GU      Musculoskeletal   Abdominal   Peds  Hematology   Anesthesia Other Findings Hx breast cancer  Reproductive/Obstetrics                            Anesthesia Physical Anesthesia Plan  ASA: II  Anesthesia Plan: General   Post-op Pain Management:    Induction: Intravenous  Airway Management Planned: LMA  Additional Equipment:   Intra-op Plan:   Post-operative Plan: Extubation in OR  Informed Consent: I have reviewed the patients History and Physical, chart, labs and discussed the procedure including the risks, benefits and alternatives for the proposed anesthesia with the patient or authorized representative who has indicated his/her understanding and acceptance.     Plan Discussed with: Anesthesiologist and CRNA  Anesthesia Plan Comments:         Anesthesia Quick Evaluation                                   Anesthesia Evaluation  Patient identified by MRN, date of birth, ID band Patient awake    Reviewed: Allergy & Precautions, NPO status , Patient's Chart, lab work & pertinent test results  Airway Mallampati: II   Neck ROM: full    Dental   Pulmonary asthma ,    breath sounds clear to auscultation       Cardiovascular negative cardio ROS   Rhythm:regular Rate:Normal     Neuro/Psych    GI/Hepatic   Endo/Other  obese  Renal/GU       Musculoskeletal   Abdominal   Peds  Hematology   Anesthesia Other Findings   Reproductive/Obstetrics                            Anesthesia Physical Anesthesia Plan  ASA: II  Anesthesia Plan: General   Post-op Pain Management:    Induction: Intravenous  Airway Management Planned: LMA  Additional Equipment:   Intra-op Plan:   Post-operative Plan:   Informed Consent: I have reviewed the patients History and Physical, chart, labs and discussed the procedure including the risks, benefits and alternatives for the proposed anesthesia with the patient or authorized representative who has indicated his/her understanding and acceptance.     Plan Discussed with:                                   Anesthesia Evaluation  Patient identified by MRN, date of birth, ID band Patient awake    Reviewed: Allergy & Precautions, NPO status , Patient's Chart, lab work & pertinent test results  Airway Mallampati: II   Neck ROM: full    Dental   Pulmonary asthma ,  breath sounds clear to auscultation       Cardiovascular negative cardio ROS   Rhythm:regular Rate:Normal     Neuro/Psych    GI/Hepatic   Endo/Other  obese  Renal/GU      Musculoskeletal   Abdominal   Peds  Hematology   Anesthesia Other Findings   Reproductive/Obstetrics                            Anesthesia Physical Anesthesia Plan  ASA: II  Anesthesia Plan: General   Post-op Pain Management:    Induction: Intravenous  Airway Management Planned: LMA  Additional Equipment:   Intra-op Plan:   Post-operative Plan:   Informed Consent: I have reviewed the patients History and Physical, chart, labs and discussed the procedure including the risks, benefits and alternatives for the proposed anesthesia with the patient or authorized representative who has indicated his/her understanding and acceptance.     Plan Discussed with:  CRNA, Anesthesiologist and Surgeon  Anesthesia Plan Comments: (Pt declined PECS block.)       Anesthesia Quick Evaluation CRNA, Anesthesiologist and Surgeon  Anesthesia Plan Comments: (Pt declined PECS block.)       Anesthesia Quick Evaluation

## 2015-06-30 NOTE — Discharge Instructions (Signed)
Ok to shower tomorrow  Ice pack and ibuprofen also for pain   Call your surgeon if you experience:   1.  Fever over 101.0. 2.  Inability to urinate. 3.  Nausea and/or vomiting. 4.  Extreme swelling or bruising at the surgical site. 5.  Continued bleeding from the incision. 6.  Increased pain, redness or drainage from the incision. 7.  Problems related to your pain medication. 8. Any change in color, movement and/or sensation 9. Any problems and/or concerns  Post Anesthesia Home Care Instructions  Activity: Get plenty of rest for the remainder of the day. A responsible adult should stay with you for 24 hours following the procedure.  For the next 24 hours, DO NOT: -Drive a car -Paediatric nurse -Drink alcoholic beverages -Take any medication unless instructed by your physician -Make any legal decisions or sign important papers.  Meals: Start with liquid foods such as gelatin or soup. Progress to regular foods as tolerated. Avoid greasy, spicy, heavy foods. If nausea and/or vomiting occur, drink only clear liquids until the nausea and/or vomiting subsides. Call your physician if vomiting continues.  Special Instructions/Symptoms: Your throat may feel dry or sore from the anesthesia or the breathing tube placed in your throat during surgery. If this causes discomfort, gargle with warm salt water. The discomfort should disappear within 24 hours.  If you had a scopolamine patch placed behind your ear for the management of post- operative nausea and/or vomiting:  1. The medication in the patch is effective for 72 hours, after which it should be removed.  Wrap patch in a tissue and discard in the trash. Wash hands thoroughly with soap and water. 2. You may remove the patch earlier than 72 hours if you experience unpleasant side effects which may include dry mouth, dizziness or visual disturbances. 3. Avoid touching the patch. Wash your hands with soap and water after contact with the  patch.

## 2015-07-01 ENCOUNTER — Ambulatory Visit: Payer: Medicare Other | Admitting: Hematology and Oncology

## 2015-07-01 ENCOUNTER — Ambulatory Visit: Payer: Medicare Other

## 2015-07-01 ENCOUNTER — Encounter (HOSPITAL_BASED_OUTPATIENT_CLINIC_OR_DEPARTMENT_OTHER): Payer: Self-pay | Admitting: Surgery

## 2015-07-01 ENCOUNTER — Other Ambulatory Visit: Payer: Medicare Other

## 2015-07-06 ENCOUNTER — Telehealth: Payer: Self-pay | Admitting: *Deleted

## 2015-07-06 NOTE — Telephone Encounter (Signed)
Received call from pt stating she is going to have a second opinion at [facility] on 4/5 and she would like to cancel her appts for 4/4. Informed pt that we want her to receive care where she feels most comfortable and request she call and let us know her decision. Pt agree. Denies further needs at this time. Encourage pt to call with further questions. Received verbal understanding.

## 2015-07-08 ENCOUNTER — Telehealth: Payer: Self-pay | Admitting: Hematology and Oncology

## 2015-07-08 NOTE — Telephone Encounter (Signed)
Patient called 3/28 to cancel her appts until she gets a second opinion.

## 2015-07-13 ENCOUNTER — Ambulatory Visit: Payer: Medicare Other

## 2015-07-13 ENCOUNTER — Other Ambulatory Visit: Payer: Medicare Other

## 2015-07-13 ENCOUNTER — Ambulatory Visit: Payer: Medicare Other | Admitting: Hematology and Oncology

## 2015-07-21 ENCOUNTER — Telehealth: Payer: Self-pay | Admitting: *Deleted

## 2015-07-21 NOTE — Telephone Encounter (Signed)
On 07-21-15 fax medical records to judy luv at [facility]  Radiation oncology it was consult note, end of tx note, sim & planning note, re-consult note, dosimetry will do there part.

## 2015-07-30 ENCOUNTER — Telehealth: Payer: Self-pay | Admitting: *Deleted

## 2015-07-30 NOTE — Telephone Encounter (Signed)
Pt has decided to receive treatment at [facility]. Physician team notified.

## 2015-09-24 ENCOUNTER — Telehealth (HOSPITAL_COMMUNITY): Payer: Self-pay | Admitting: Vascular Surgery

## 2015-09-24 NOTE — Telephone Encounter (Signed)
Left pt message to make f/u appt w/ [facility] 

## 2015-10-04 ENCOUNTER — Telehealth (HOSPITAL_COMMUNITY): Payer: Self-pay | Admitting: Vascular Surgery

## 2015-10-04 NOTE — Telephone Encounter (Signed)
Left pt message to make f/u appt w/ [facility] 

## 2015-10-04 NOTE — Telephone Encounter (Signed)
Pt is following up w/ [facility] for cardiology, pt will not make any more appt at this office

## 2015-10-11 ENCOUNTER — Other Ambulatory Visit: Payer: Self-pay | Admitting: Nurse Practitioner

## 2016-12-27 ENCOUNTER — Emergency Department (HOSPITAL_COMMUNITY): Payer: Medicare Other

## 2016-12-27 ENCOUNTER — Encounter (HOSPITAL_COMMUNITY): Payer: Self-pay

## 2016-12-27 ENCOUNTER — Inpatient Hospital Stay (HOSPITAL_COMMUNITY)
Admission: EM | Admit: 2016-12-27 | Discharge: 2017-01-08 | DRG: 682 | Disposition: E | Payer: Medicare Other | Attending: Internal Medicine | Admitting: Internal Medicine

## 2016-12-27 DIAGNOSIS — J9690 Respiratory failure, unspecified, unspecified whether with hypoxia or hypercapnia: Secondary | ICD-10-CM

## 2016-12-27 DIAGNOSIS — N179 Acute kidney failure, unspecified: Principal | ICD-10-CM | POA: Diagnosis present

## 2016-12-27 DIAGNOSIS — Z803 Family history of malignant neoplasm of breast: Secondary | ICD-10-CM

## 2016-12-27 DIAGNOSIS — D649 Anemia, unspecified: Secondary | ICD-10-CM | POA: Diagnosis present

## 2016-12-27 DIAGNOSIS — E875 Hyperkalemia: Secondary | ICD-10-CM | POA: Diagnosis present

## 2016-12-27 DIAGNOSIS — K56600 Partial intestinal obstruction, unspecified as to cause: Secondary | ICD-10-CM | POA: Diagnosis present

## 2016-12-27 DIAGNOSIS — Z79899 Other long term (current) drug therapy: Secondary | ICD-10-CM

## 2016-12-27 DIAGNOSIS — D72829 Elevated white blood cell count, unspecified: Secondary | ICD-10-CM

## 2016-12-27 DIAGNOSIS — E871 Hypo-osmolality and hyponatremia: Secondary | ICD-10-CM | POA: Diagnosis present

## 2016-12-27 DIAGNOSIS — R579 Shock, unspecified: Secondary | ICD-10-CM

## 2016-12-27 DIAGNOSIS — E872 Acidosis: Secondary | ICD-10-CM | POA: Diagnosis present

## 2016-12-27 DIAGNOSIS — Z7983 Long term (current) use of bisphosphonates: Secondary | ICD-10-CM

## 2016-12-27 DIAGNOSIS — K567 Ileus, unspecified: Secondary | ICD-10-CM | POA: Diagnosis present

## 2016-12-27 DIAGNOSIS — A419 Sepsis, unspecified organism: Secondary | ICD-10-CM | POA: Diagnosis present

## 2016-12-27 DIAGNOSIS — Z66 Do not resuscitate: Secondary | ICD-10-CM | POA: Diagnosis present

## 2016-12-27 DIAGNOSIS — Z452 Encounter for adjustment and management of vascular access device: Secondary | ICD-10-CM

## 2016-12-27 DIAGNOSIS — C495 Malignant neoplasm of connective and soft tissue of pelvis: Secondary | ICD-10-CM | POA: Diagnosis present

## 2016-12-27 DIAGNOSIS — Z885 Allergy status to narcotic agent status: Secondary | ICD-10-CM

## 2016-12-27 DIAGNOSIS — R339 Retention of urine, unspecified: Secondary | ICD-10-CM | POA: Diagnosis present

## 2016-12-27 DIAGNOSIS — L03115 Cellulitis of right lower limb: Secondary | ICD-10-CM | POA: Diagnosis present

## 2016-12-27 DIAGNOSIS — C50312 Malignant neoplasm of lower-inner quadrant of left female breast: Secondary | ICD-10-CM | POA: Diagnosis present

## 2016-12-27 DIAGNOSIS — R1900 Intra-abdominal and pelvic swelling, mass and lump, unspecified site: Secondary | ICD-10-CM | POA: Diagnosis present

## 2016-12-27 DIAGNOSIS — R7989 Other specified abnormal findings of blood chemistry: Secondary | ICD-10-CM | POA: Diagnosis present

## 2016-12-27 DIAGNOSIS — R180 Malignant ascites: Secondary | ICD-10-CM | POA: Diagnosis present

## 2016-12-27 DIAGNOSIS — L03119 Cellulitis of unspecified part of limb: Secondary | ICD-10-CM | POA: Diagnosis present

## 2016-12-27 DIAGNOSIS — E861 Hypovolemia: Secondary | ICD-10-CM | POA: Diagnosis present

## 2016-12-27 DIAGNOSIS — J9811 Atelectasis: Secondary | ICD-10-CM | POA: Diagnosis present

## 2016-12-27 DIAGNOSIS — J810 Acute pulmonary edema: Secondary | ICD-10-CM | POA: Diagnosis present

## 2016-12-27 DIAGNOSIS — Z9221 Personal history of antineoplastic chemotherapy: Secondary | ICD-10-CM

## 2016-12-27 DIAGNOSIS — M549 Dorsalgia, unspecified: Secondary | ICD-10-CM | POA: Diagnosis present

## 2016-12-27 DIAGNOSIS — N280 Ischemia and infarction of kidney: Secondary | ICD-10-CM

## 2016-12-27 DIAGNOSIS — Z4659 Encounter for fitting and adjustment of other gastrointestinal appliance and device: Secondary | ICD-10-CM

## 2016-12-27 DIAGNOSIS — E039 Hypothyroidism, unspecified: Secondary | ICD-10-CM | POA: Diagnosis present

## 2016-12-27 DIAGNOSIS — M419 Scoliosis, unspecified: Secondary | ICD-10-CM | POA: Diagnosis present

## 2016-12-27 DIAGNOSIS — L03116 Cellulitis of left lower limb: Secondary | ICD-10-CM | POA: Diagnosis present

## 2016-12-27 DIAGNOSIS — C50412 Malignant neoplasm of upper-outer quadrant of left female breast: Secondary | ICD-10-CM | POA: Diagnosis present

## 2016-12-27 DIAGNOSIS — G8929 Other chronic pain: Secondary | ICD-10-CM | POA: Diagnosis present

## 2016-12-27 DIAGNOSIS — M79A3 Nontraumatic compartment syndrome of abdomen: Secondary | ICD-10-CM | POA: Diagnosis present

## 2016-12-27 LAB — CBC WITH DIFFERENTIAL/PLATELET
BASOS ABS: 0 10*3/uL (ref 0.0–0.1)
BASOS PCT: 0 %
EOS PCT: 0 %
Eosinophils Absolute: 0 10*3/uL (ref 0.0–0.7)
HCT: 26.4 % — ABNORMAL LOW (ref 36.0–46.0)
Hemoglobin: 8.9 g/dL — ABNORMAL LOW (ref 12.0–15.0)
Lymphocytes Relative: 9 %
Lymphs Abs: 1.8 10*3/uL (ref 0.7–4.0)
MCH: 28.8 pg (ref 26.0–34.0)
MCHC: 33.7 g/dL (ref 30.0–36.0)
MCV: 85.4 fL (ref 78.0–100.0)
MONO ABS: 0.3 10*3/uL (ref 0.1–1.0)
Monocytes Relative: 2 %
Neutro Abs: 18.3 10*3/uL — ABNORMAL HIGH (ref 1.7–7.7)
Neutrophils Relative %: 89 %
PLATELETS: 488 10*3/uL — AB (ref 150–400)
RBC: 3.09 MIL/uL — ABNORMAL LOW (ref 3.87–5.11)
RDW: 17.7 % — AB (ref 11.5–15.5)
WBC: 20.4 10*3/uL — ABNORMAL HIGH (ref 4.0–10.5)

## 2016-12-27 LAB — BLOOD GAS, VENOUS
ACID-BASE DEFICIT: 11.9 mmol/L — AB (ref 0.0–2.0)
Bicarbonate: 14 mmol/L — ABNORMAL LOW (ref 20.0–28.0)
O2 SAT: 99 %
PATIENT TEMPERATURE: 98.6
PH VEN: 7.248 — AB (ref 7.250–7.430)
pCO2, Ven: 33.2 mmHg — ABNORMAL LOW (ref 44.0–60.0)
pO2, Ven: 188 mmHg — ABNORMAL HIGH (ref 32.0–45.0)

## 2016-12-27 LAB — COMPREHENSIVE METABOLIC PANEL
ALBUMIN: 2.7 g/dL — AB (ref 3.5–5.0)
ALT: 16 U/L (ref 14–54)
ANION GAP: 16 — AB (ref 5–15)
AST: 26 U/L (ref 15–41)
Alkaline Phosphatase: 62 U/L (ref 38–126)
BUN: 97 mg/dL — AB (ref 6–20)
CHLORIDE: 98 mmol/L — AB (ref 101–111)
CO2: 14 mmol/L — ABNORMAL LOW (ref 22–32)
Calcium: 8.9 mg/dL (ref 8.9–10.3)
Creatinine, Ser: 3.9 mg/dL — ABNORMAL HIGH (ref 0.44–1.00)
GFR calc Af Amer: 13 mL/min — ABNORMAL LOW (ref >60)
GFR, EST NON AFRICAN AMERICAN: 11 mL/min — AB (ref >60)
Glucose, Bld: 115 mg/dL — ABNORMAL HIGH (ref 65–99)
POTASSIUM: 7.4 mmol/L — AB (ref 3.5–5.1)
Sodium: 128 mmol/L — ABNORMAL LOW (ref 135–145)
Total Bilirubin: 0.5 mg/dL (ref 0.3–1.2)
Total Protein: 5.7 g/dL — ABNORMAL LOW (ref 6.5–8.1)

## 2016-12-27 LAB — PROTIME-INR
INR: 1.16
PROTHROMBIN TIME: 14.7 s (ref 11.4–15.2)

## 2016-12-27 LAB — URINALYSIS, ROUTINE W REFLEX MICROSCOPIC
Bilirubin Urine: NEGATIVE
GLUCOSE, UA: 50 mg/dL — AB
KETONES UR: 5 mg/dL — AB
Nitrite: NEGATIVE
PH: 5 (ref 5.0–8.0)
Protein, ur: 30 mg/dL — AB
Specific Gravity, Urine: 1.024 (ref 1.005–1.030)

## 2016-12-27 LAB — LIPASE, BLOOD: Lipase: 18 U/L (ref 11–51)

## 2016-12-27 LAB — I-STAT CG4 LACTIC ACID, ED: Lactic Acid, Venous: 1.95 mmol/L — ABNORMAL HIGH (ref 0.5–1.9)

## 2016-12-27 MED ORDER — PIPERACILLIN-TAZOBACTAM 3.375 G IVPB 30 MIN
3.3750 g | Freq: Once | INTRAVENOUS | Status: AC
  Administered 2016-12-27: 3.375 g via INTRAVENOUS
  Filled 2016-12-27: qty 50

## 2016-12-27 MED ORDER — SODIUM CHLORIDE 0.9 % IV BOLUS (SEPSIS): 1000.0000 mL | Freq: Once | INTRAVENOUS | Status: DC

## 2016-12-27 MED ORDER — SODIUM CHLORIDE 0.9 % IV SOLN
1.0000 g | Freq: Once | INTRAVENOUS | Status: AC
  Administered 2016-12-28: 1 g via INTRAVENOUS
  Filled 2016-12-27: qty 10

## 2016-12-27 MED ORDER — INSULIN ASPART 100 UNIT/ML ~~LOC~~ SOLN
5.0000 [IU] | Freq: Once | SUBCUTANEOUS | Status: AC
  Administered 2016-12-28: 5 [IU] via INTRAVENOUS
  Filled 2016-12-27: qty 1

## 2016-12-27 MED ORDER — VANCOMYCIN HCL IN DEXTROSE 1-5 GM/200ML-% IV SOLN
1000.0000 mg | Freq: Once | INTRAVENOUS | Status: AC
  Administered 2016-12-28: 1000 mg via INTRAVENOUS
  Filled 2016-12-27: qty 200

## 2016-12-27 MED ORDER — DEXTROSE 50 % IV SOLN
1.0000 | Freq: Once | INTRAVENOUS | Status: AC
  Administered 2016-12-28: 50 mL via INTRAVENOUS
  Filled 2016-12-27: qty 50

## 2016-12-27 MED ORDER — SODIUM CHLORIDE 0.9 % IV BOLUS (SEPSIS)
500.0000 mL | Freq: Once | INTRAVENOUS | Status: AC
  Administered 2016-12-27: 500 mL via INTRAVENOUS

## 2016-12-27 MED ORDER — SODIUM CHLORIDE 0.9 % IV BOLUS (SEPSIS)
1000.0000 mL | Freq: Once | INTRAVENOUS | Status: AC
  Administered 2016-12-27: 1000 mL via INTRAVENOUS

## 2016-12-27 NOTE — ED Provider Notes (Addendum)
[facility] DEPT Provider Note   CSN: [id number] Arrival date & time: 01/06/2017  1903     History   Chief Complaint Chief Complaint  Patient presents with  . Urinary Retention    HPI Ana Woodward is a 71 y.o. female.  HPI   Presents with concern for decreased urination.  Had been urinating abouty 200cc each time until yesterday she urinated at 9AM very small amount, and since then has not urinated.  Has not felt the urge to urinate.  Eating and drinking ok.  No fevers. No increasing abdominal pain, does report tightness in abdomen.  8-10/10, reports it is just as bad as it was prior to the paracentesis.   Some sensation of reflux, pain with swallowing.  Passing flatus, having normal BM.  Is on doxycycline for bilateral LE cellulitis for which daughter reports great improvement in both swelling and cellulitis.   89/58 normal blood pressures per patient and daughter, on chart review noted to be 34s and 90s on most recent visits. 100s earlier although daughter reports it is typically 80s-90s her whole life.   Past Medical History:  Diagnosis Date  . Asthma    as child, no problems now  . Breast cancer ([place])    left breast cancer 2010-lumpectomy, radiation  . H/O bone density study 2010  . H/O colonoscopy 2016  . History of breast cancer   . Pap smear for cervical cancer screening 2010  . Radiation 02/01/09-03/22/09   left breast 5040 cGy  . Seasonal allergies   . Snores     Patient Active Problem List   Diagnosis Date Noted  . Pelvic mass 12/28/2016  . Normocytic anemia 12/28/2016  . Cellulitis, leg 12/28/2016  . Sepsis ([place]) 12/28/2016  . AKI (acute kidney injury) ([place]) 12/28/2016  . Hyperkalemia 12/28/2016  . Hyponatremia 12/28/2016  . Hypothyroidism 12/28/2016  . Asthma   . Breast cancer of upper-outer quadrant of left female breast ([place]) 06/08/2011    Past Surgical History:  Procedure Laterality Date  . BREAST BIOPSY Left 03/23/15  . BREAST LUMPECTOMY   11/06/2008   left with sentinel node biopsy  . BREAST LUMPECTOMY WITH RADIOACTIVE SEED AND SENTINEL LYMPH NODE BIOPSY Left 06/02/2015   Procedure: LEFT BREAST LUMPECTOMY WITH RADIOACTIVE SEED AND SENTINEL LYMPH NODE BIOPSY;  Surgeon: Coralie Keens, MD;  Location: [facility];  Service: General;  Laterality: Left;  . EYE SURGERY  10/13/2011, 11/10/2011   right and left  . HERNIA REPAIR Right 1959  . MASS EXCISION N/A 06/02/2015   Procedure: EXCISION BACK  MASS;  Surgeon: Coralie Keens, MD;  Location: [facility];  Service: General;  Laterality: N/A;  . PORTACATH PLACEMENT Right 06/30/2015   Procedure: INSERTION PORT-A-CATH;  Surgeon: Coralie Keens, MD;  Location: [facility];  Service: General;  Laterality: Right;  . RE-EXCISION OF BREAST LUMPECTOMY Left 06/30/2015   Procedure: RE-EXCISION OFLEFT BREAST CANCER;  Surgeon: Coralie Keens, MD;  Location: [facility];  Service: General;  Laterality: Left;    OB History    No data available       Home Medications    Prior to Admission medications   Medication Sig Start Date End Date Taking? Authorizing Provider  acetaminophen (TYLENOL) 650 MG CR tablet Take 1,300 mg by mouth every 6 (six) hours as needed for pain.   Yes [provider]  alendronate (FOSAMAX) 70 MG tablet Take 70 mg by mouth every 7 (seven) days. On sundays 10/05/16  10/05/17 Yes [provider]  doxycycline (VIBRAMYCIN) 100 MG capsule Take 1 capsule by mouth 2 (two) times daily. 12/26/16  Yes [provider]  enoxaparin (LOVENOX) 40 MG/0.4ML injection Inject 40 mg into the skin daily. 12/14/16  Yes [provider]  letrozole (FEMARA) 2.5 MG tablet Take 1 tablet by mouth daily. 10/05/16 10/05/17 Yes [provider]  levothyroxine (SYNTHROID, LEVOTHROID) 112 MCG tablet Take 1 tablet by mouth daily. 12/14/16 01/13/17 Yes [provider]  sodium chloride 1 g tablet Take 1 g by  mouth 3 (three) times daily. 12/15/16 01/14/17 Yes [provider]  spironolactone (ALDACTONE) 50 MG tablet Take 50 mg by mouth daily.   Yes [provider]  traMADol (ULTRAM) 50 MG tablet Take 1-2 tablets (50-100 mg total) by mouth every 6 (six) hours as needed. Patient not taking: Reported on 12/28/2016 06/30/15   Coralie Keens, MD    Family History Family History  Problem Relation Age of Onset  . Heart murmur Mother   . Diabetes type II Mother   . Kidney cancer Mother   . Heart disease Mother   . Cancer Mother        tumor on right kidney  . Breast cancer Paternal Aunt   . Cancer Paternal Aunt        breast  . Colon cancer Maternal Uncle     Social History Social History  Substance Use Topics  . Smoking status: Never Smoker  . Smokeless tobacco: Never Used  . Alcohol use No     Allergies   Codeine and Vicodin [hydrocodone-acetaminophen]   Review of Systems Review of Systems  Constitutional: Negative for fever.  HENT: Negative for sore throat.   Eyes: Negative for visual disturbance.  Respiratory: Negative for cough and shortness of breath.   Cardiovascular: Positive for chest pain (with swallowing feels things get stuck temporarily and is uncomfortable).  Gastrointestinal: Positive for abdominal pain. Negative for constipation, diarrhea, nausea and vomiting.  Genitourinary: Positive for difficulty urinating. Negative for dysuria.  Musculoskeletal: Negative for back pain and neck pain.  Skin: Negative for rash.  Neurological: Positive for light-headedness. Negative for syncope and headaches.     Physical Exam Updated Vital Signs BP (!) 90/41   Pulse 80   Temp (!) 96.3 F (35.7 C) (Axillary)   Resp (!) 27   Ht 5\' 8"  (1.727 m)   Wt 109.3 kg (241 lb)   SpO2 100%   BMI 36.64 kg/m   Physical Exam  Constitutional: She is oriented to person, place, and time. She appears cachectic. She has a sickly appearance. She appears ill. No distress.    HENT:  Head: Normocephalic and atraumatic.  Temporal wasting  Eyes: Conjunctivae and EOM are normal.  Neck: Normal range of motion.  Cardiovascular: Normal rate, regular rhythm, normal heart sounds and intact distal pulses.  Exam reveals no gallop and no friction rub.   No murmur heard. Pulmonary/Chest: Effort normal. No respiratory distress. She has no wheezes. She has rales (occasional bases).  Abdominal: Soft. She exhibits distension and ascites. There is tenderness. There is no guarding.  Mild erythema at incision  Musculoskeletal: She exhibits edema (bilateral 2+). She exhibits no tenderness.  Neurological: She is alert and oriented to person, place, and time.  Skin: Skin is warm and dry. No rash noted. She is not diaphoretic. There is erythema (bilateral lower extremities, right greater than left with right skin ulceration).  Nursing note and vitals reviewed.    ED Treatments /  Results  Labs (all labs ordered are listed, but only abnormal results are displayed) Labs Reviewed  URINALYSIS, ROUTINE W REFLEX MICROSCOPIC - Abnormal; Notable for the following:       Result Value   Color, Urine AMBER (*)    APPearance TURBID (*)    Glucose, UA 50 (*)    Hgb urine dipstick SMALL (*)    Ketones, ur 5 (*)    Protein, ur 30 (*)    Leukocytes, UA TRACE (*)    Bacteria, UA RARE (*)    Squamous Epithelial / LPF 0-5 (*)    All other components within normal limits  CBC WITH DIFFERENTIAL/PLATELET - Abnormal; Notable for the following:    WBC 20.4 (*)    RBC 3.09 (*)    Hemoglobin 8.9 (*)    HCT 26.4 (*)    RDW 17.7 (*)    Platelets 488 (*)    Neutro Abs 18.3 (*)    All other components within normal limits  COMPREHENSIVE METABOLIC PANEL - Abnormal; Notable for the following:    Sodium 128 (*)    Potassium 7.4 (*)    Chloride 98 (*)    CO2 14 (*)    Glucose, Bld 115 (*)    BUN 97 (*)    Creatinine, Ser 3.90 (*)    Total Protein 5.7 (*)    Albumin 2.7 (*)    GFR calc non Af  Amer 11 (*)    GFR calc Af Amer 13 (*)    Anion gap 16 (*)    All other components within normal limits  BLOOD GAS, VENOUS - Abnormal; Notable for the following:    pH, Ven 7.248 (*)    pCO2, Ven 33.2 (*)    pO2, Ven 188.0 (*)    Bicarbonate 14.0 (*)    Acid-base deficit 11.9 (*)    All other components within normal limits  BASIC METABOLIC PANEL - Abnormal; Notable for the following:    Sodium 128 (*)    Potassium 7.0 (*)    Chloride 100 (*)    CO2 15 (*)    BUN 97 (*)    Creatinine, Ser 4.03 (*)    Calcium 8.8 (*)    GFR calc non Af Amer 10 (*)    GFR calc Af Amer 12 (*)    All other components within normal limits  BRAIN NATRIURETIC PEPTIDE - Abnormal; Notable for the following:    B Natriuretic Peptide 120.4 (*)    All other components within normal limits  I-STAT CG4 LACTIC ACID, ED - Abnormal; Notable for the following:    Lactic Acid, Venous 1.95 (*)    All other components within normal limits  I-STAT CG4 LACTIC ACID, ED - Abnormal; Notable for the following:    Lactic Acid, Venous 3.22 (*)    All other components within normal limits  I-STAT CHEM 8, ED - Abnormal; Notable for the following:    Sodium 125 (*)    Potassium 6.9 (*)    BUN 94 (*)    Creatinine, Ser 4.20 (*)    TCO2 15 (*)    Hemoglobin 9.5 (*)    HCT 28.0 (*)    All other components within normal limits  CULTURE, BLOOD (ROUTINE X 2)  CULTURE, BLOOD (ROUTINE X 2)  LIPASE, BLOOD  PROTIME-INR  CREATININE, URINE, RANDOM  UREA NITROGEN, URINE  SEDIMENTATION RATE  C-REACTIVE PROTEIN  BASIC METABOLIC PANEL  CBC  LACTIC ACID, PLASMA  LACTIC ACID,  PLASMA  PROCALCITONIN  APTT  BASIC METABOLIC PANEL    EKG  EKG Interpretation  Date/Time:  Wednesday December 27 2016 21:03:35 EDT Ventricular Rate:  67 PR Interval:    QRS Duration: 98 QT Interval:  395 QTC Calculation: 417 R Axis:   41 Text Interpretation:  Sinus rhythm Borderline prolonged PR interval Borderline low voltage, extremity leads  No significant change since last tracing Confirmed by Gareth Morgan [phone]) on 12/26/2016 10:19:50 PM Also confirmed by Gareth Morgan [phone])  on 12/17/2016 10:42:29 PM       Radiology Ct Abdomen Pelvis Wo Contrast  Result Date: 12/31/2016 CLINICAL DATA:  Pt was dx with a pelvic mass at the beginning of August, she has surgery at the end of August, the mass was unable to be removed and she's suppose to start [facility] next week at [facility] Today pt complains of urinary retention since 9am yesterday, abdomen is distended and tight, weeping cellulitis in both lower extremities. EXAM: CT ABDOMEN AND PELVIS WITHOUT CONTRAST TECHNIQUE: Multidetector CT imaging of the abdomen and pelvis was performed following the standard protocol without IV contrast. COMPARISON:  None. FINDINGS: Evaluation is difficult without IV or oral contrast. Lower chest: Mild bibasilar atelectasis. Hepatobiliary: Liver is unremarkable. Gallbladder is not seen, presumably compressed by adjacent mass and/or bowel. No intrahepatic bile duct dilatation appreciated. Pancreas: Compressed by adjacent bowel but otherwise unremarkable. Spleen: Normal in size without focal abnormality. Adrenals/Urinary Tract: Adrenal glands are unremarkable. Kidneys are unremarkable without mass, stone or hydronephrosis. No perinephric fluid. Bladder is obscured by the pelvic mass and surrounding bowel loops, presumably displaced to the posterior pelvis and moderately distended. Stomach/Bowel: Visualized portions of the large bowel appear decompressed. Majority of the small bowel is displaced into the upper abdomen, fluid-filled and at least mildly distended suggesting partial obstruction or ileus. Vascular/Lymphatic: No significant vascular findings are present. No enlarged abdominal or pelvic lymph nodes. Reproductive: Obscured by the pelvic mass. Other: Massive complex cystic and solid mass occupying the majority of the pelvis and lower abdomen, with a probable  cystic component extending into the left upper quadrant, overall measuring at least 26 cm craniocaudal dimension, 29 cm transverse dimension and 24 cm AP dimension. As above, the mass appears to be displacing the moderately distended bladder into the posterior pelvis. Musculoskeletal: Degenerative change throughout the scoliotic thoracolumbar spine, mild to moderate in degree. No acute or suspicious osseous finding. Ill-defined fluid/edema within the subcutaneous soft tissues of the lower abdomen and pelvis indicating anasarca. IMPRESSION: 1. Massive complex cystic and solid mass occupying the majority of the pelvis and lower abdomen, probable cystic component extending into the left upper quadrant, overall measuring at least 26 cm craniocaudal dimension, 29 cm transverse dimension and 24 cm AP dimension. 2. Mass appears to be displacing the moderately distended bladder into the posterior inferior pelvis. 3. Moderately distended fluid-filled loops of small bowel displaced into the upper abdomen, indicating partial small bowel obstruction or ileus. Large bowel is relatively decompressed, some portions of the bowel are difficult to see due to the intra-abdominal mass. 4. Anasarca. Electronically Signed   By: Franki Cabot M.D.   On: 12/16/2016 23:04   Dg Chest Port 1 View  Result Date: 12/20/2016 CLINICAL DATA:  Leukocytosis.  Shortness of breath. EXAM: PORTABLE CHEST 1 VIEW COMPARISON:  06/30/2015 FINDINGS: Right central venous catheter with tip over the low SVC region. No pneumothorax. Shallow inspiration. Mild cardiac enlargement. No vascular congestion or edema. No consolidation or airspace disease. Degenerative changes in the  spine and shoulders. Mild thoracolumbar scoliosis convex towards the right. IMPRESSION: Cardiac enlargement. Shallow inspiration. No evidence of active pulmonary disease. Electronically Signed   By: Lucienne Capers M.D.   On: 01/05/2017 22:56    Procedures Procedures (including  critical care time)  Medications Ordered in ED Medications  acetaminophen (TYLENOL) tablet 650 mg (not administered)  letrozole (FEMARA) tablet 2.5 mg (not administered)  levothyroxine (SYNTHROID, LEVOTHROID) tablet 112 mcg (not administered)  sodium chloride tablet 1 g (not administered)  pantoprazole (PROTONIX) injection 40 mg (not administered)  ondansetron (ZOFRAN) tablet 4 mg (not administered)    Or  ondansetron (ZOFRAN) injection 4 mg (not administered)  zolpidem (AMBIEN) tablet 5 mg (not administered)  insulin aspart (novoLOG) injection 5 Units (not administered)  dextrose 50 % solution 50 mL (not administered)  morphine 2 MG/ML injection 1 mg (not administered)  sodium chloride 0.9 % bolus 1,000 mL (1,000 mLs Intravenous New Bag/Given 12/19/2016 2311)  sodium chloride 0.9 % bolus 500 mL (500 mLs Intravenous New Bag/Given 12/31/2016 2311)  piperacillin-tazobactam (ZOSYN) IVPB 3.375 g (0 g Intravenous Stopped 12/28/16 0005)  vancomycin (VANCOCIN) IVPB 1000 mg/200 mL premix (0 mg Intravenous Stopped 12/28/16 0212)  insulin aspart (novoLOG) injection 5 Units (5 Units Intravenous Given 12/28/16 0006)  dextrose 50 % solution 50 mL (50 mLs Intravenous Given 12/28/16 0005)  calcium gluconate 1 g in sodium chloride 0.9 % 100 mL IVPB (0 g Intravenous Stopped 12/28/16 0034)  sodium chloride 0.9 % bolus 1,000 mL (1,000 mLs Intravenous New Bag/Given 12/28/16 0234)  CRITICAL CARE: hyperkalemia, AKI Performed by: Alvino Chapel   Total critical care time: 50 minutes  Critical care time was exclusive of separately billable procedures and treating other patients.  Critical care was necessary to treat or prevent imminent or life-threatening deterioration.  Critical care was time spent personally by me on the following activities: development of treatment plan with patient and/or surrogate as well as nursing, discussions with consultants, evaluation of patient's response to treatment,  examination of patient, obtaining history from patient or surrogate, ordering and performing treatments and interventions, ordering and review of laboratory studies, ordering and review of radiographic studies, pulse oximetry and re-evaluation of patient's condition.    Initial Impression / Assessment and Plan / ED Course  I have reviewed the triage vital signs and the nursing notes.  Pertinent labs & imaging results that were available during my care of the patient were reviewed by me and considered in my medical decision making (see chart for details).     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  Bedside ultrasound done showing 95 mL in the bladder, with abnormal bladder shape being compressed by adjacent tumor. In out catheterization was done which yielded very small amount of urine.  Labs significant for leukocytosis, acute kidney injury with a creatinine increased to 3.9 from 0.6, severe hyperkalemia with a potassium of 7.5.  Regarding leukocytosis, this has been noted on multiple prior outpatient visits. It is relatively unchanged from prior 18,000 and 19,000 seen. Her blood pressures are low, but daughter reports this is baseline. Last 2 outpatient visits noted to have blood pressures in the 96E and 95M systolic.  She is on doxycycline currently for cellulitis, which family reports has significantly improved, and was noted to improve yesterday an outpatient note.  There is no sign of UTI.  No sign of pneumonia. Cannot rule out spontaneous bacterial peritonitis. She does not have an easily accessible fluid for bedside paracentesis.  Overall, I do not suspect sepsis and per outpatient and patient/daughter history feel this is secondary to baseline.  However, I provided 2500cc NS, vancomycin and zosyn to cover for possible  sepsis.   Regarding AKI, ordered CT abdomen pelvis for further evaluation. CT shows large mass pressing on distended bladder.  No hydronephrosis.  Discussed with Dr. Augustin Coupe of Nephrology and Dr. Diona Fanti of Urology.  Agree this is unlikely to be causing obstruction with AKI given no hydronephrosis, do not feel urologic procedure indicated.   AKI may be secondary to dehydration, r/o sepsis.  Regarding hyperkalemia, no clear signs of hyperk on EKG, however low voltage makes peaked twaves difficult to rule out.   Given calcium gluconate, insulin, dextrose, albuterol 20mg  neb.  Discussed with Nephrology, Dr. Augustin Coupe.  Discussed possibility of abdominal compartment syndrome, I do feel patient while she has sig distention and ascites has soft abdomen, and overall low suspicion. Ordered bladder pressure.Ordered foley catheter to monitor I/os.  Repeat K improving however continued elevation. Given additional insulin/dextrose.  Will transfer to [facility] for possible dialysis if K is not improving.  She is not a candidate for kayexalate given ileus on CT.  Bowel distended, likely ileus. No vomiting, passing flatus, doubt acute mechanical SBO.   Patient to be transferred to [facility] for admission for multiple medical problems and is in critical condition.  There are no stepdown beds available and she and family are anticipating holding in the ED until this becomes available, and ED provider notified. Discussed with them while the prefer to go to [facility], I do not feel she is stable enough for transfer given severe hyperkalemia and they state understanding.   Nephrology and Hospitalist to be called on her arrival to [facility].     Final Clinical Impressions(s) / ED Diagnoses   Final diagnoses:  Acute kidney injury (HCC)  Hyperkalemia  Ileus (HCC)  Pelvic mass  Leukocytosis, unspecified type    New Prescriptions New Prescriptions   No medications on file     Gareth Morgan, MD 12/28/16 0245      Gareth Morgan, MD 12/28/16 [phone]

## 2016-12-27 NOTE — ED Notes (Signed)
EKG given to EDP,Schlossman,MD., for review. 

## 2016-12-27 NOTE — ED Triage Notes (Signed)
Pt was dx with a pelvic mass at the beginning of August, she has surgery at the end of August, the mass was unable to be removed and she's suppose to start chemo next week at [facility] Today pt complains of urinary retention since 9am yesterday, her abdomen is distended and tight Pt also has weeping cellulitis in both lower extremities

## 2016-12-27 NOTE — ED Notes (Signed)
Pt in CT.

## 2016-12-27 NOTE — Progress Notes (Signed)
A consult was received from an ED physician for vancomycin and zosyn  per pharmacy dosing.  The patient's profile has been reviewed for ht/wt/allergies/indication/available labs.   A one time order has been placed for zosyn 3.75 Gm and Vancomycin 1 Gm.  Further antibiotics/pharmacy consults should be ordered by admitting physician if indicated.                       Thank you,  Dorrene German 12/23/2016 11:15 PM

## 2016-12-27 NOTE — ED Notes (Signed)
Bladder scanned performed 3 times with the highest reading above 249mL.

## 2016-12-28 ENCOUNTER — Inpatient Hospital Stay (HOSPITAL_COMMUNITY): Payer: Medicare Other

## 2016-12-28 DIAGNOSIS — D72829 Elevated white blood cell count, unspecified: Secondary | ICD-10-CM

## 2016-12-28 DIAGNOSIS — J45909 Unspecified asthma, uncomplicated: Secondary | ICD-10-CM | POA: Insufficient documentation

## 2016-12-28 DIAGNOSIS — K567 Ileus, unspecified: Secondary | ICD-10-CM

## 2016-12-28 DIAGNOSIS — E875 Hyperkalemia: Secondary | ICD-10-CM | POA: Diagnosis present

## 2016-12-28 DIAGNOSIS — E871 Hypo-osmolality and hyponatremia: Secondary | ICD-10-CM | POA: Diagnosis present

## 2016-12-28 DIAGNOSIS — Z885 Allergy status to narcotic agent status: Secondary | ICD-10-CM | POA: Diagnosis not present

## 2016-12-28 DIAGNOSIS — A419 Sepsis, unspecified organism: Secondary | ICD-10-CM | POA: Diagnosis not present

## 2016-12-28 DIAGNOSIS — N179 Acute kidney failure, unspecified: Secondary | ICD-10-CM | POA: Diagnosis present

## 2016-12-28 DIAGNOSIS — E861 Hypovolemia: Secondary | ICD-10-CM | POA: Diagnosis present

## 2016-12-28 DIAGNOSIS — J810 Acute pulmonary edema: Secondary | ICD-10-CM | POA: Diagnosis present

## 2016-12-28 DIAGNOSIS — Z9221 Personal history of antineoplastic chemotherapy: Secondary | ICD-10-CM | POA: Diagnosis not present

## 2016-12-28 DIAGNOSIS — L03116 Cellulitis of left lower limb: Secondary | ICD-10-CM

## 2016-12-28 DIAGNOSIS — R579 Shock, unspecified: Secondary | ICD-10-CM | POA: Diagnosis present

## 2016-12-28 DIAGNOSIS — C50312 Malignant neoplasm of lower-inner quadrant of left female breast: Secondary | ICD-10-CM | POA: Diagnosis present

## 2016-12-28 DIAGNOSIS — E039 Hypothyroidism, unspecified: Secondary | ICD-10-CM | POA: Diagnosis present

## 2016-12-28 DIAGNOSIS — E872 Acidosis: Secondary | ICD-10-CM | POA: Diagnosis present

## 2016-12-28 DIAGNOSIS — C50412 Malignant neoplasm of upper-outer quadrant of left female breast: Secondary | ICD-10-CM | POA: Diagnosis not present

## 2016-12-28 DIAGNOSIS — Z803 Family history of malignant neoplasm of breast: Secondary | ICD-10-CM | POA: Diagnosis not present

## 2016-12-28 DIAGNOSIS — D649 Anemia, unspecified: Secondary | ICD-10-CM

## 2016-12-28 DIAGNOSIS — J9811 Atelectasis: Secondary | ICD-10-CM | POA: Diagnosis present

## 2016-12-28 DIAGNOSIS — R7989 Other specified abnormal findings of blood chemistry: Secondary | ICD-10-CM | POA: Diagnosis present

## 2016-12-28 DIAGNOSIS — C495 Malignant neoplasm of connective and soft tissue of pelvis: Secondary | ICD-10-CM | POA: Diagnosis present

## 2016-12-28 DIAGNOSIS — R180 Malignant ascites: Secondary | ICD-10-CM | POA: Diagnosis present

## 2016-12-28 DIAGNOSIS — R1900 Intra-abdominal and pelvic swelling, mass and lump, unspecified site: Secondary | ICD-10-CM | POA: Diagnosis not present

## 2016-12-28 DIAGNOSIS — M79A3 Nontraumatic compartment syndrome of abdomen: Secondary | ICD-10-CM | POA: Diagnosis present

## 2016-12-28 DIAGNOSIS — K56600 Partial intestinal obstruction, unspecified as to cause: Secondary | ICD-10-CM | POA: Diagnosis present

## 2016-12-28 DIAGNOSIS — L03115 Cellulitis of right lower limb: Secondary | ICD-10-CM | POA: Diagnosis present

## 2016-12-28 DIAGNOSIS — L03119 Cellulitis of unspecified part of limb: Secondary | ICD-10-CM | POA: Diagnosis present

## 2016-12-28 DIAGNOSIS — Z79899 Other long term (current) drug therapy: Secondary | ICD-10-CM | POA: Diagnosis not present

## 2016-12-28 DIAGNOSIS — Z7983 Long term (current) use of bisphosphonates: Secondary | ICD-10-CM | POA: Diagnosis not present

## 2016-12-28 LAB — TROPONIN I: Troponin I: <0.03 ng/mL (ref <0.03)

## 2016-12-28 LAB — ECHOCARDIOGRAM COMPLETE
Height: 68 in
Weight: 3856 oz

## 2016-12-28 LAB — POCT I-STAT 3, ART BLOOD GAS (G3+)
Acid-base deficit: 12 mmol/L — ABNORMAL HIGH (ref 0.0–2.0)
Acid-base deficit: 3 mmol/L — ABNORMAL HIGH (ref 0.0–2.0)
Bicarbonate: 13 mmol/L — ABNORMAL LOW (ref 20.0–28.0)
Bicarbonate: 20.7 mmol/L (ref 20.0–28.0)
O2 SAT: 99 %
O2 Saturation: 99 %
PCO2 ART: 24.7 mmHg — AB (ref 32.0–48.0)
PCO2 ART: 30 mmHg — AB (ref 32.0–48.0)
PO2 ART: 113 mmHg — AB (ref 83.0–108.0)
Patient temperature: 94
Patient temperature: 98.39
TCO2: 14 mmol/L — AB (ref 22–32)
TCO2: 22 mmol/L (ref 22–32)
pH, Arterial: 7.317 — ABNORMAL LOW (ref 7.350–7.450)
pH, Arterial: 7.447 (ref 7.350–7.450)
pO2, Arterial: 108 mmHg (ref 83.0–108.0)

## 2016-12-28 LAB — COMPREHENSIVE METABOLIC PANEL
ALK PHOS: 53 U/L (ref 38–126)
ALT: 13 U/L — ABNORMAL LOW (ref 14–54)
AST: 27 U/L (ref 15–41)
Albumin: 3 g/dL — ABNORMAL LOW (ref 3.5–5.0)
Anion gap: 8 (ref 5–15)
BILIRUBIN TOTAL: 0.6 mg/dL (ref 0.3–1.2)
BUN: 41 mg/dL — AB (ref 6–20)
CO2: 24 mmol/L (ref 22–32)
Calcium: 8 mg/dL — ABNORMAL LOW (ref 8.9–10.3)
Chloride: 100 mmol/L — ABNORMAL LOW (ref 101–111)
Creatinine, Ser: 2.04 mg/dL — ABNORMAL HIGH (ref 0.44–1.00)
GFR calc Af Amer: 27 mL/min — ABNORMAL LOW (ref >60)
GFR calc non Af Amer: 24 mL/min — ABNORMAL LOW (ref >60)
GLUCOSE: 91 mg/dL (ref 65–99)
POTASSIUM: 3.9 mmol/L (ref 3.5–5.1)
Sodium: 132 mmol/L — ABNORMAL LOW (ref 135–145)
TOTAL PROTEIN: 5.3 g/dL — AB (ref 6.5–8.1)

## 2016-12-28 LAB — CBC
HCT: 23.7 % — ABNORMAL LOW (ref 36.0–46.0)
HEMATOCRIT: 21.3 % — AB (ref 36.0–46.0)
HEMOGLOBIN: 7.2 g/dL — AB (ref 12.0–15.0)
HEMOGLOBIN: 7.7 g/dL — AB (ref 12.0–15.0)
MCH: 29.4 pg (ref 26.0–34.0)
MCH: 28.2 pg (ref 26.0–34.0)
MCHC: 33.8 g/dL (ref 30.0–36.0)
MCHC: 32.5 g/dL (ref 30.0–36.0)
MCV: 86.9 fL (ref 78.0–100.0)
MCV: 86.8 fL (ref 78.0–100.0)
PLATELETS: 404 10*3/uL — AB (ref 150–400)
Platelets: 451 10*3/uL — ABNORMAL HIGH (ref 150–400)
RBC: 2.45 MIL/uL — AB (ref 3.87–5.11)
RBC: 2.73 MIL/uL — AB (ref 3.87–5.11)
RDW: 17.7 % — ABNORMAL HIGH (ref 11.5–15.5)
RDW: 17.6 % — ABNORMAL HIGH (ref 11.5–15.5)
WBC: 21.4 10*3/uL — AB (ref 4.0–10.5)
WBC: 16.6 10*3/uL — AB (ref 4.0–10.5)

## 2016-12-28 LAB — BASIC METABOLIC PANEL
ANION GAP: 13 (ref 5–15)
Anion gap: 13 (ref 5–15)
BUN: 97 mg/dL — AB (ref 6–20)
BUN: 95 mg/dL — ABNORMAL HIGH (ref 6–20)
CHLORIDE: 100 mmol/L — AB (ref 101–111)
CO2: 15 mmol/L — ABNORMAL LOW (ref 22–32)
CO2: 14 mmol/L — ABNORMAL LOW (ref 22–32)
Calcium: 8.8 mg/dL — ABNORMAL LOW (ref 8.9–10.3)
Calcium: 8 mg/dL — ABNORMAL LOW (ref 8.9–10.3)
Chloride: 100 mmol/L — ABNORMAL LOW (ref 101–111)
Creatinine, Ser: 4.03 mg/dL — ABNORMAL HIGH (ref 0.44–1.00)
Creatinine, Ser: 3.9 mg/dL — ABNORMAL HIGH (ref 0.44–1.00)
GFR calc Af Amer: 12 mL/min — ABNORMAL LOW (ref >60)
GFR calc Af Amer: 13 mL/min — ABNORMAL LOW (ref >60)
GFR calc non Af Amer: 10 mL/min — ABNORMAL LOW (ref >60)
GFR, EST NON AFRICAN AMERICAN: 11 mL/min — AB (ref >60)
GLUCOSE: 92 mg/dL (ref 65–99)
Glucose, Bld: 130 mg/dL — ABNORMAL HIGH (ref 65–99)
POTASSIUM: 7 mmol/L — AB (ref 3.5–5.1)
POTASSIUM: 6.7 mmol/L — AB (ref 3.5–5.1)
SODIUM: 127 mmol/L — AB (ref 135–145)
Sodium: 128 mmol/L — ABNORMAL LOW (ref 135–145)

## 2016-12-28 LAB — TSH: TSH: 8.243 u[IU]/mL — ABNORMAL HIGH (ref 0.350–4.500)

## 2016-12-28 LAB — RENAL FUNCTION PANEL
ANION GAP: 12 (ref 5–15)
Albumin: 2.3 g/dL — ABNORMAL LOW (ref 3.5–5.0)
BUN: 96 mg/dL — ABNORMAL HIGH (ref 6–20)
CALCIUM: 8 mg/dL — AB (ref 8.9–10.3)
CHLORIDE: 100 mmol/L — AB (ref 101–111)
CO2: 16 mmol/L — AB (ref 22–32)
CREATININE: 3.96 mg/dL — AB (ref 0.44–1.00)
GFR, EST AFRICAN AMERICAN: 12 mL/min — AB (ref >60)
GFR, EST NON AFRICAN AMERICAN: 11 mL/min — AB (ref >60)
Glucose, Bld: 108 mg/dL — ABNORMAL HIGH (ref 65–99)
Phosphorus: 7.9 mg/dL — ABNORMAL HIGH (ref 2.5–4.6)
Potassium: 6.9 mmol/L (ref 3.5–5.1)
Sodium: 128 mmol/L — ABNORMAL LOW (ref 135–145)

## 2016-12-28 LAB — LACTIC ACID, PLASMA
LACTIC ACID, VENOUS: 1.8 mmol/L (ref 0.5–1.9)
Lactic Acid, Venous: 3.3 mmol/L (ref 0.5–1.9)
Lactic Acid, Venous: 2 mmol/L (ref 0.5–1.9)

## 2016-12-28 LAB — PHOSPHORUS
Phosphorus: 8 mg/dL — ABNORMAL HIGH (ref 2.5–4.6)
Phosphorus: 3.8 mg/dL (ref 2.5–4.6)

## 2016-12-28 LAB — GLUCOSE, CAPILLARY
GLUCOSE-CAPILLARY: 126 mg/dL — AB (ref 65–99)
GLUCOSE-CAPILLARY: 98 mg/dL (ref 65–99)
GLUCOSE-CAPILLARY: 104 mg/dL — AB (ref 65–99)
GLUCOSE-CAPILLARY: 85 mg/dL (ref 65–99)
Glucose-Capillary: 84 mg/dL (ref 65–99)

## 2016-12-28 LAB — MAGNESIUM
Magnesium: 2.2 mg/dL (ref 1.7–2.4)
Magnesium: 2.1 mg/dL (ref 1.7–2.4)

## 2016-12-28 LAB — APTT: APTT: 25 s (ref 24–36)

## 2016-12-28 LAB — SEDIMENTATION RATE: Sed Rate: 9 mm/hr (ref 0–22)

## 2016-12-28 LAB — CK: Total CK: 74 U/L (ref 38–234)

## 2016-12-28 LAB — IRON AND TIBC
IRON: 15 ug/dL — AB (ref 28–170)
Saturation Ratios: 11 % (ref 10.4–31.8)
TIBC: 137 ug/dL — ABNORMAL LOW (ref 250–450)
UIBC: 122 ug/dL

## 2016-12-28 LAB — I-STAT CHEM 8, ED
BUN: 94 mg/dL — ABNORMAL HIGH (ref 6–20)
CREATININE: 4.2 mg/dL — AB (ref 0.44–1.00)
Calcium, Ion: 1.17 mmol/L (ref 1.15–1.40)
Chloride: 101 mmol/L (ref 101–111)
GLUCOSE: 88 mg/dL (ref 65–99)
HCT: 28 % — ABNORMAL LOW (ref 36.0–46.0)
Hemoglobin: 9.5 g/dL — ABNORMAL LOW (ref 12.0–15.0)
POTASSIUM: 6.9 mmol/L — AB (ref 3.5–5.1)
SODIUM: 125 mmol/L — AB (ref 135–145)
TCO2: 15 mmol/L — ABNORMAL LOW (ref 22–32)

## 2016-12-28 LAB — MRSA PCR SCREENING: MRSA BY PCR: NEGATIVE

## 2016-12-28 LAB — BRAIN NATRIURETIC PEPTIDE: B NATRIURETIC PEPTIDE 5: 120.4 pg/mL — AB (ref 0.0–100.0)

## 2016-12-28 LAB — C-REACTIVE PROTEIN: CRP: 9.2 mg/dL — ABNORMAL HIGH (ref <1.0)

## 2016-12-28 LAB — FERRITIN: Ferritin: 358 ng/mL — ABNORMAL HIGH (ref 11–307)

## 2016-12-28 LAB — I-STAT CG4 LACTIC ACID, ED: Lactic Acid, Venous: 3.22 mmol/L (ref 0.5–1.9)

## 2016-12-28 LAB — PROCALCITONIN: PROCALCITONIN: 2.09 ng/mL

## 2016-12-28 MED ORDER — SODIUM CHLORIDE 0.9 % IV SOLN
INTRAVENOUS | Status: DC
  Administered 2016-12-28: 06:00:00 via INTRAVENOUS

## 2016-12-28 MED ORDER — ZOLPIDEM TARTRATE 5 MG PO TABS: 5.0000 mg | ORAL_TABLET | Freq: Every evening | ORAL | Status: DC | PRN

## 2016-12-28 MED ORDER — HEPARIN SODIUM (PORCINE) 1000 UNIT/ML IJ SOLN
3000.0000 [IU] | Freq: Once | INTRAMUSCULAR | Status: AC
  Administered 2016-12-28: 1000 [IU]

## 2016-12-28 MED ORDER — ALBUTEROL (5 MG/ML) CONTINUOUS INHALATION SOLN
20.0000 mg/h | INHALATION_SOLUTION | RESPIRATORY_TRACT | Status: DC
  Administered 2016-12-28: 20 mg/h via RESPIRATORY_TRACT
  Filled 2016-12-28: qty 20

## 2016-12-28 MED ORDER — VANCOMYCIN HCL 10 G IV SOLR: 1500.0000 mg | INTRAVENOUS | Status: DC

## 2016-12-28 MED ORDER — ALBUMIN HUMAN 25 % IV SOLN
INTRAVENOUS | Status: AC
  Administered 2016-12-28: 12.5 g
  Filled 2016-12-28: qty 100

## 2016-12-28 MED ORDER — LEVOTHYROXINE SODIUM 112 MCG PO TABS: 112.0000 ug | ORAL_TABLET | Freq: Every day | ORAL | Status: DC

## 2016-12-28 MED ORDER — SODIUM CHLORIDE 0.9 % IV BOLUS (SEPSIS)
1000.0000 mL | Freq: Once | INTRAVENOUS | Status: AC
  Administered 2016-12-28: 1000 mL via INTRAVENOUS

## 2016-12-28 MED ORDER — ONDANSETRON HCL 4 MG/2ML IJ SOLN
4.0000 mg | Freq: Four times a day (QID) | INTRAMUSCULAR | Status: DC | PRN
  Administered 2016-12-28 – 2017-01-01 (×5): 4 mg via INTRAVENOUS
  Filled 2016-12-28 (×5): qty 2

## 2016-12-28 MED ORDER — DEXTROSE 50 % IV SOLN: 50.0000 mL | INTRAVENOUS | Status: DC | PRN

## 2016-12-28 MED ORDER — PENTAFLUOROPROP-TETRAFLUOROETH EX AERO: 1.0000 "application " | INHALATION_SPRAY | CUTANEOUS | Status: DC | PRN

## 2016-12-28 MED ORDER — ALBUMIN HUMAN 5 % IV SOLN
12.5000 g | Freq: Once | INTRAVENOUS | Status: AC
  Administered 2016-12-28: 12.5 g via INTRAVENOUS
  Filled 2016-12-28: qty 250

## 2016-12-28 MED ORDER — SODIUM CHLORIDE 0.9 % IV BOLUS (SEPSIS)
250.0000 mL | Freq: Once | INTRAVENOUS | Status: AC
  Administered 2016-12-28: 250 mL via INTRAVENOUS

## 2016-12-28 MED ORDER — SODIUM CHLORIDE 0.9 % IV SOLN
0.0000 ug/min | INTRAVENOUS | Status: DC
  Administered 2016-12-28: 100 ug/min via INTRAVENOUS
  Administered 2016-12-28: 20 ug/min via INTRAVENOUS
  Administered 2016-12-28: 100 ug/min via INTRAVENOUS
  Filled 2016-12-28 (×3): qty 1

## 2016-12-28 MED ORDER — LIDOCAINE-PRILOCAINE 2.5-2.5 % EX CREA
1.0000 "application " | TOPICAL_CREAM | CUTANEOUS | Status: DC | PRN
  Filled 2016-12-28: qty 5

## 2016-12-28 MED ORDER — FUROSEMIDE 10 MG/ML IJ SOLN: 20.0000 mg | Freq: Once | INTRAMUSCULAR | Status: DC

## 2016-12-28 MED ORDER — LIDOCAINE HCL (PF) 1 % IJ SOLN: 5.0000 mL | INTRAMUSCULAR | Status: DC | PRN

## 2016-12-28 MED ORDER — HEPARIN SODIUM (PORCINE) 1000 UNIT/ML DIALYSIS: 1000.0000 [IU] | INTRAMUSCULAR | Status: DC | PRN

## 2016-12-28 MED ORDER — SODIUM CHLORIDE 1 G PO TABS: 1.0000 g | ORAL_TABLET | Freq: Three times a day (TID) | ORAL | Status: DC

## 2016-12-28 MED ORDER — FUROSEMIDE 10 MG/ML IJ SOLN
40.0000 mg | Freq: Once | INTRAMUSCULAR | Status: AC
  Administered 2016-12-28: 40 mg via INTRAVENOUS
  Filled 2016-12-28: qty 4

## 2016-12-28 MED ORDER — MORPHINE SULFATE (PF) 2 MG/ML IV SOLN
1.0000 mg | INTRAVENOUS | Status: DC | PRN
  Administered 2016-12-28 (×2): 1 mg via INTRAVENOUS
  Filled 2016-12-28 (×2): qty 1

## 2016-12-28 MED ORDER — PIPERACILLIN-TAZOBACTAM IN DEX 2-0.25 GM/50ML IV SOLN
2.2500 g | Freq: Four times a day (QID) | INTRAVENOUS | Status: DC
  Administered 2016-12-29 (×3): 2.25 g via INTRAVENOUS
  Filled 2016-12-28 (×9): qty 50

## 2016-12-28 MED ORDER — SODIUM CHLORIDE 0.9 % IV SOLN: 100.0000 mL | INTRAVENOUS | Status: DC | PRN

## 2016-12-28 MED ORDER — INSULIN ASPART 100 UNIT/ML ~~LOC~~ SOLN
5.0000 [IU] | Freq: Once | SUBCUTANEOUS | Status: AC
  Administered 2016-12-28: 5 [IU] via INTRAVENOUS
  Filled 2016-12-28: qty 1

## 2016-12-28 MED ORDER — INSULIN ASPART 100 UNIT/ML ~~LOC~~ SOLN: 5.0000 [IU] | Freq: Once | SUBCUTANEOUS | Status: DC

## 2016-12-28 MED ORDER — ALTEPLASE 2 MG IJ SOLR
2.0000 mg | Freq: Once | INTRAMUSCULAR | Status: DC | PRN
  Filled 2016-12-28: qty 2

## 2016-12-28 MED ORDER — VANCOMYCIN HCL IN DEXTROSE 1-5 GM/200ML-% IV SOLN: 1000.0000 mg | INTRAVENOUS | Status: AC

## 2016-12-28 MED ORDER — SODIUM CHLORIDE 0.9 % IV SOLN
250.0000 mL | INTRAVENOUS | Status: DC | PRN
  Administered 2017-01-01: 250 mL via INTRAVENOUS

## 2016-12-28 MED ORDER — ONDANSETRON HCL 4 MG PO TABS: 4.0000 mg | ORAL_TABLET | Freq: Four times a day (QID) | ORAL | Status: DC | PRN

## 2016-12-28 MED ORDER — LETROZOLE 2.5 MG PO TABS: 2.5000 mg | ORAL_TABLET | Freq: Every day | ORAL | Status: DC

## 2016-12-28 MED ORDER — LEVOTHYROXINE SODIUM 100 MCG IV SOLR
56.0000 ug | Freq: Every day | INTRAVENOUS | Status: DC
Start: 2016-12-28 — End: 2017-01-02
  Administered 2016-12-28 – 2017-01-01 (×5): 56 ug via INTRAVENOUS
  Filled 2016-12-28 (×5): qty 5

## 2016-12-28 MED ORDER — PANTOPRAZOLE SODIUM 40 MG IV SOLR
40.0000 mg | Freq: Two times a day (BID) | INTRAVENOUS | Status: DC | PRN
  Administered 2016-12-30: 40 mg via INTRAVENOUS
  Filled 2016-12-28: qty 40

## 2016-12-28 MED ORDER — ALBUMIN HUMAN 5 % IV SOLN: 25.0000 g | Freq: Once | INTRAVENOUS | Status: DC

## 2016-12-28 MED ORDER — HEPARIN 1000 UNIT/ML FOR PERITONEAL DIALYSIS
3000.0000 [IU] | INTRAMUSCULAR | Status: DC | PRN
  Filled 2016-12-28 (×4): qty 3

## 2016-12-28 MED ORDER — SODIUM CHLORIDE 0.9 % IV SOLN
0.0000 ug/min | INTRAVENOUS | Status: DC
  Administered 2016-12-28: 100 ug/min via INTRAVENOUS
  Administered 2016-12-29: 250 ug/min via INTRAVENOUS
  Administered 2016-12-29: 175 ug/min via INTRAVENOUS
  Administered 2016-12-29: 225 ug/min via INTRAVENOUS
  Filled 2016-12-28 (×4): qty 4

## 2016-12-28 MED ORDER — ACETAMINOPHEN 325 MG PO TABS: 650.0000 mg | ORAL_TABLET | Freq: Four times a day (QID) | ORAL | Status: DC | PRN

## 2016-12-28 MED ORDER — DEXTROSE 50 % IV SOLN
1.0000 | Freq: Once | INTRAVENOUS | Status: AC
  Administered 2016-12-28: 50 mL via INTRAVENOUS
  Filled 2016-12-28: qty 50

## 2016-12-28 NOTE — Progress Notes (Signed)
..   Name: Ana Woodward MRN: [id number] DOB: 1945/08/11    ADMISSION DATE:  12/14/2016 CONSULTATION DATE:  12/28/16  REFERRING MD :  Gabriel Cirri  CHIEF COMPLAINT:  No Urine output >24 hrs & SOB   BRIEF PATIENT DESCRIPTION: 71 yr old female with a PMHx significant for breast cancer, 25x17x19cm pelvic mass on CT s/p biopsy presents hypotensive and SOB with oliguria, hyperkalemia and acidosis.   SIGNIFICANT EVENTS  Rt porta cath in place>> 9/19 lt I j 3 port hd cath>>  STUDIES:  CT Abdomen/Pelvis as noted CXR pending>>   HISTORY OF PRESENT ILLNESS: (History obtained from patient, daughter, and chart)  71 yr old female with PMHx of Breast cancer diagnosed in 2008 s/p lumpectomy s/p chemorads and had a period of remission until recurrence in 2016 s/p lumpectomy at that time followed by chemotherapy. During her workup she received a CT Abd/pelvis recealing a large pelvic mass She was referred to [facility] who scheduled patient for removal of mass but due to high vascularity of tumor only a biopsy was performed. Since then she has had a complicated course including symptomatic hyponatremia requiring a 3% NS IVF, bilateral lower extremity cellulitis for which she received 2 courses of antibiotics most recently doxycycline. She presents to [facility] with SOB, and a h/o >24 hrs of not making urine. At baseline she uses a walker around the house but in the last 48 hrs she has been unable to perform at her normal activity level per her daughter     SUBJECTIVE:  Ill appearing. HD cath placed. Hypotensive but dry. Fluids given. Hopefully avoid pressors. Needs palliation.  VITAL SIGNS: Temp:  [96.3 F (35.7 C)-97.4 F (36.3 C)] 97.3 F (36.3 C) (09/20 0711) Pulse Rate:  [55-93] 66 (09/20 0900) Resp:  [16-34] 17 (09/20 0900) BP: (78-144)/(41-106) 78/52 (09/20 0900) SpO2:  [94 %-100 %] 100 % (09/20 0900) Weight:  [241 lb (109.3 kg)] 241 lb (109.3 kg) (09/19 2316)  PHYSICAL EXAMINATION: General:  Frail.  Ill appearing, wasted female HEENT: Dry oral mucosa ION:GEXB affect Neuro: Intact CV: HSR RRR PULM: Shallow resp GI:Abd distended, decreased bowel sounds, +ascites Extremities: warm/dry, decreased muscle mass Skin: skin tear on leg    Recent Labs Lab 12/19/2016 2049 12/28/16 0119 12/28/16 0125 12/28/16 0500  NA 128* 128* 125* 127*  K 7.4* 7.0* 6.9* 6.7*  CL 98* 100* 101 100*  CO2 14* 15*  --  14*  BUN 97* 97* 94* 95*  CREATININE 3.90* 4.03* 4.20* 3.90*  GLUCOSE 115* 92 88 130*    Recent Labs Lab 12/19/2016 2049 12/28/16 0125 12/28/16 0500  HGB 8.9* 9.5* 7.2*  HCT 26.4* 28.0* 21.3*  WBC 20.4*  --  21.4*  PLT 488*  --  451*   Ct Abdomen Pelvis Wo Contrast  Result Date: 12/10/2016 CLINICAL DATA:  Pt was dx with a pelvic mass at the beginning of August, she has surgery at the end of August, the mass was unable to be removed and she's suppose to start chemo next week at [facility] Today pt complains of urinary retention since 9am yesterday, abdomen is distended and tight, weeping cellulitis in both lower extremities. EXAM: CT ABDOMEN AND PELVIS WITHOUT CONTRAST TECHNIQUE: Multidetector CT imaging of the abdomen and pelvis was performed following the standard protocol without IV contrast. COMPARISON:  None. FINDINGS: Evaluation is difficult without IV or oral contrast. Lower chest: Mild bibasilar atelectasis. Hepatobiliary: Liver is unremarkable. Gallbladder is not seen, presumably compressed by adjacent mass and/or bowel.  No intrahepatic bile duct dilatation appreciated. Pancreas: Compressed by adjacent bowel but otherwise unremarkable. Spleen: Normal in size without focal abnormality. Adrenals/Urinary Tract: Adrenal glands are unremarkable. Kidneys are unremarkable without mass, stone or hydronephrosis. No perinephric fluid. Bladder is obscured by the pelvic mass and surrounding bowel loops, presumably displaced to the posterior pelvis and moderately distended. Stomach/Bowel: Visualized  portions of the large bowel appear decompressed. Majority of the small bowel is displaced into the upper abdomen, fluid-filled and at least mildly distended suggesting partial obstruction or ileus. Vascular/Lymphatic: No significant vascular findings are present. No enlarged abdominal or pelvic lymph nodes. Reproductive: Obscured by the pelvic mass. Other: Massive complex cystic and solid mass occupying the majority of the pelvis and lower abdomen, with a probable cystic component extending into the left upper quadrant, overall measuring at least 26 cm craniocaudal dimension, 29 cm transverse dimension and 24 cm AP dimension. As above, the mass appears to be displacing the moderately distended bladder into the posterior pelvis. Musculoskeletal: Degenerative change throughout the scoliotic thoracolumbar spine, mild to moderate in degree. No acute or suspicious osseous finding. Ill-defined fluid/edema within the subcutaneous soft tissues of the lower abdomen and pelvis indicating anasarca. IMPRESSION: 1. Massive complex cystic and solid mass occupying the majority of the pelvis and lower abdomen, probable cystic component extending into the left upper quadrant, overall measuring at least 26 cm craniocaudal dimension, 29 cm transverse dimension and 24 cm AP dimension. 2. Mass appears to be displacing the moderately distended bladder into the posterior inferior pelvis. 3. Moderately distended fluid-filled loops of small bowel displaced into the upper abdomen, indicating partial small bowel obstruction or ileus. Large bowel is relatively decompressed, some portions of the bowel are difficult to see due to the intra-abdominal mass. 4. Anasarca. Electronically Signed   By: Franki Cabot M.D.   On: 12/20/2016 23:04   Dg Chest Port 1 View  Result Date: 01/04/2017 CLINICAL DATA:  Leukocytosis.  Shortness of breath. EXAM: PORTABLE CHEST 1 VIEW COMPARISON:  06/30/2015 FINDINGS: Right central venous catheter with tip over  the low SVC region. No pneumothorax. Shallow inspiration. Mild cardiac enlargement. No vascular congestion or edema. No consolidation or airspace disease. Degenerative changes in the spine and shoulders. Mild thoracolumbar scoliosis convex towards the right. IMPRESSION: Cardiac enlargement. Shallow inspiration. No evidence of active pulmonary disease. Electronically Signed   By: Lucienne Capers M.D.   On: 12/15/2016 22:56    ASSESSMENT / PLAN: 71 yr old female with Large pelvic mass presenting with oliguria and  hyperkalemia   Active Issues: 1. Acute Renal Failure: Hyperkalemia -> emergent HD 2. Partial Small bowel obstruction 3. 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 Hypotension - Abdominal compartment syndrome?  NEURO: Hyponatremia Pt is alert and oriented answers questions not symptomatic No focal deficits  neuirochecks Q 4  Chronic Back pain due to scoliosis- not currently in pain Continue to reassess Morphine prn on board  CARDIAC: Hypotensive Abdominal Compartment Syndrome vs Sepsis + hypovolemia Sepsis Sources of infection: SBP vs GU and significant LE cellulitis noted Send blood cx x 2 Started on empiric antibiotics with Vancomycin and Zosyn IV Lactic acid noted 3.3  Abdominal compartment syndrome due to volume of ascites and size of mass and location - monitor bladder pressures  - decompression required if >20 , surgery has elevated and declines surgical approach and recommends palliation. Last ECHO in March 2017 EF 55-60% Repeat ECHO needed.  MAP goal >65   PULMONARY: on supplemental O2- goal Sat >92% Dyspneic Mild bibasilar  atelectasis Decreased lung volumes due to abdominal pathology decreasing lung volume CXR shows no evidence of pleural effusion  ID: Suspected sources: SBP vs GU and significant LE cellulitis noted Sent blood cultures  Started on empiric antibiotics with Vancomycin and Zosyn vanc random level sent  Trend lactic, WBC and fever  curve  Endocrine: CBG (last 3)   Recent Labs  12/28/16 0658  GLUCAP 126*     Cortisol level BG checks with ISS Per ICU Glycemic protocol H/o Hypothyroidism - check TSH- continues on synthroid  GI: NPO for now Start on GI prophylaxis  Prn Zofran for nausea Significant Ascites most likely secondary to malignancy Has had large volume paracentesis on 9/14 Repeat paracentesis will most likely require IR - if large volume may require albumin. ? SBP - empiric coverage with abx  Heme: Normocytic anemia with anisocytosis  Recent Labs  12/28/16 0125 12/28/16 0500  HGB 9.5* 7.2*    no obvious source of bleeding on exam FOBT May also be Anemia of chronic disease Send Iron TIBC ferritin If Hgb<7 transfuse PRBCs  Thrombocytosis- most likely reactive in setting of malignancy VTE PPx   RENAL Lab Results  Component Value Date   CREATININE 3.90 (H) 12/28/2016   CREATININE 4.20 (H) 12/28/2016   CREATININE 4.03 (H) 12/28/2016    Recent Labs Lab 12/28/16 0119 12/28/16 0125 12/28/16 0500  K 7.0* 6.9* 6.7*     Acute Renal Failure: ACS vs ATN vs postobstructive , acidosis  Bladder is moderately distended on CT  Place indwelling foley catheter  Transduce bladder pressures Strict Is and Os UA sent  FeNA Renal on board appreciate recs Challenge with HD Trialysis  placed Baseline Cr on 12/21/16 was 0.9 .. Lab Results  Component Value Date   CREATININE 3.90 (H) 12/28/2016   CREATININE 4.20 (H) 12/28/2016   CREATININE 4.03 (H) 12/28/2016  Hyperkalemia Pt received protocol which transiently brought K+ from 7.0 to 6.7 EKG reviewed low voltage with slight peaking of T waves in anterolateral leads  Hypovolemic  Hyponatremia, chronic and has required 3% saline in recent past  Recent Labs Lab 12/28/16 0119 12/28/16 0125 12/28/16 0500  NA 128* 125* 127*    Pt is alert and oriented answers questions not symptomatic On IVF pt has received 1.5 L  For HD should  improve with volume correction She is hypovolemic with cvp low by manual inspection. therefore additional fluids 9/22  .[name] Anion Gap Acidosis elevated LA 3.22->3,3 Hypoperfusion Acute renal failure IVF 1 litre 9/22 CRRT to correct acidosis     Code Status Orders        Start     Ordered   12/28/16 0537  Full code  Continuous     12/28/16 0537    Code Status History    Date Active Date Inactive Code Status Order ID Comments User Context   12/28/2016  1:22 AM 12/28/2016  5:37 AM Full Code [id number]  Ivor Costa, MD ED     DISPOSITION: ICU  [facility] CODE STATUS: Full, needs palliative care consult NEXT OF KIN: eldest daughter ( 4 more children) CRITICAL CARE TIME: 41 min  Richardson Landry Minor ACNP Maryanna Shape PCCM Pager [phone] till 3 pm If no answer page [phone] 12/28/2016, 11:15 AM  STAFF NOTE: I, Merrie Roof, MD FACP have personally reviewed patient's available data, including medical history, events of note, physical examination and test results as part of my evaluation. I have discussed with resident/NP and other care providers such as pharmacist, RN  and RRT. 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 as of now, chemo and then resection has been offerred, her bladder seemed obstructed and foley has been placed, she appears 3rd spacing with temporal muslce wasting presenting with ARF, acidosis, lactic acid, hyperkalemia, severe ascites in setting with a likely poor prognosis sarcoma tumur, emergent HD cath placement, renal saw pt early, consider bicarb for K if  needed, re assess abg and assess need NIMV if acidosis worsens as would contribute to k, empiric abx for risk infected ascites in this setting hypotension, ensure [facility] sent, repeat lactic, may need diagnostic tap, low threshold use neo to support MAP especially with HD planned, if fails then to CVVHD, re assess k , consider albumin and follow response, I had extensive d/w pt and daughter, prognosis from onocology was not received, she is not an immediate candidate now for chemo given now MODS in icu, re assess this after aggressive treatment, but given her over all status I am concerned about her having a poor prognosis and I exoressed that to daughter, they are discussing DNR statusdc any further lasix, she is concerning for intravsacualr depltion, 3rd spacing and poor preload from mass The patient is critically ill with multiple organ systems failure and requires high complexity decision making for assessment and support, frequent evaluation and titration of therapies, application of advanced monitoring technologies and extensive interpretation of multiple databases.   Critical Care Time devoted to patient care services described in this note is 45 Minutes. This time reflects time of care of this signee: Merrie Roof, MD FACP. This critical care time does not reflect procedure time, or teaching time or supervisory time of PA/NP/Med student/Med Resident etc but could involve care discussion time. Rest per NP/medical resident whose note is outlined above and that I agree with   Lavon Paganini. Titus Mould, MD, [place] Pgr: [facility] 12/28/2016 12:30 PM

## 2016-12-28 NOTE — Progress Notes (Signed)
Ana Gammon, MD  Gynecologic Oncology   GYNECOLOGIC ONCOLOGY   Referring Provider: Maryla Morrow, ANP and Lanetta Inch, MD  Chief Complaint: Pelvic sarcoma, poor performance status, AKI  History of Present Illness:  Ana Woodward is a 71 y.o. woman who presented with a large pelvic mass and ascites.  The abdominopelvic mass was incidentally identified while undergoing cardiac clearance for a hernia repair in 09/2016.  During an echo she was found to have "at least" moderate ascites. A subsequent CT showed a 25 x 17 x 19 cm mass that appears to arise from the left adnexa, large volume ascites, peritoneal studding in the pelvis, and a small left pleural effusion.   She then presented to her breast cancer providers who ordered imaging.   CT C/A/P 11/24/16       FINDINGS:  CHEST: LUNGS/AIRWAY/PLEURA: Small left pleural effusion. There is atelectasis in the lung bases bilaterally. Scattered 3 mm subpleural right upper lobe pulmonary nodules (2:17). No endobronchial lesions. MEDIASTINUM/VESSELS: Heart is normal in size and contour. No pericardial effusion. Scattered coronary atherosclerotic calcification. Right-sided implantable venous access device with catheter tip in the lower SVC. THORACIC LYMPH NODES:No adenopathy. ABDOMEN and PELVIS:  LIVER: Unremarkable GALLBLADDER: Unremarkable SPLEEN: Unremarkable PANCREAS:Unremarkable ADRENALS: Unremarkable KIDNEYS: No hydronephrosis. No renal calculi. GU: The bladder is collapsed and poorly visualized. The uterus is poorly visualized. There is a heterogeneous multilocular pelvic mass which measures up to 25 x 17 x 19 cm the mass appears to arise from the left adnexa with enhancing septa and mostly cystic superior portion. There are a few focal areas of calcification within the mass.  BOWEL/PERITONEUM: Small hiatal hernia. Large pelvic mass compresses the distal colon however no evidence of bowel obstruction. There is low-density  fluid throughout the abdomen and pelvis. There is some peritoneal studding in the pelvis (2:176, 181  There is a small fluid containing hernia superior to the umbilicus.  VASCULATURE: The aorta, celiac, SMA, and IMA are patent and normal in caliber. The portal vein, SMV and splenic vein are patent.  LYMPH NODES: No enlarged lymph nodes in the abdomen or pelvis.  BONES/SOFT TISSUE: No acute osseous abnormality or suspicious osseous lesion. Levoscoliosis of the thoracolumbar spine.    Impression    Large heterogeneous pelvic mass, appears to arise from the left adnexa. 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 Breast cancer metastatic to the ovaries is an additional consideration, but thought less likely given lack of other metastatic breast disease.  There is a large volume of ascites fluid as well as peritoneal studding in the pelvis.  Small left pleural effusion.   Procedure on 12/06/16: Exploratory laparotomy with biopsy of mass, removal of ascites, excision of umbilical hernia  Operative Findings: EUA with smooth cervix, no nodularity. Edematous subcutaneous tissues and bloody ascites encountered upon abdominal entry. There is a 25cm mass extending to bilateral pelvic side walls and extending to the upper abdomen. Irregular l and multi lobulated, with large vessels over the surface. The tumor fractures easy and is very friable.The estimated blood loss associated with the biopsy was over 300 mL The mass obscured the pelvic structures . Upper abdominal survey limited secondary to the size of the mass. Mass appears to involve both the large and small bowel. Anterior peritoneum with areas of tumor implants, removed and sent for permanent. 3cm umbilical hernia, excised and closed in the fascial closure. 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 The operative report noting an irregular and multi lobulated, friable mass obscuring the pelvic structures and appearing to involve both the large and small bowel has also been reviewed.  The specimen demonstrates a poorly differentiated malignant neoplasm composed of epithelioid to spindled pleomorphic cells growing in a solid and fascicular pattern. Immunohistochemical stains were performed on block C1 with adequate controls (see disclaimer) to further characterize the tumor. The tumor cells are positive for vimentin and patchy positive for SMA, very focally positive for AE 1/3, CAM 5.2, OSCAR, desmin, WT-1, GATA-3 myogenin, and CD10 while they are negative for S100, CD45, CD31, CD34, PAX-8, ER, PR, and CD117.  The morphology is that of a poorly differentiated malignant neoplasm and the immunohistochemical phenotype is not entirely specific. Overall, based on areas of spindling and staining pattern, a sarcoma/sarcomatous component is considered, including undifferentiated pleomorphic sarcoma. The possibility of a more differentiated sarcomatous component (leiomyosarcoma, etc) or carcinosarcoma cannot be excluded in this biopsy sampling. While the keratin staining is focal and patchy, given the radiologic impression of left adnexal origin, the possibility of a poorly differentiated carcinoma also cannot be entirely excluded based on this limited sampling.  [name]   Postop course  was complicated by significant hyponatremia. 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 Endocrine consult was obtained to facilitate management. [name] was referred to medical oncology [sarcoma] given the diagnosis. A significant reduction in her hemoglobin presumed secondary to continued bleeding after biopsy of the large pelvic mass required red blood cell transfusion.   Also underwent thoracocentesis for removal of pleural fluid.  Cytology was negative. She was seen by Dr. Denman George on December 19, 2016.  Her performance status at that time was regarded as to poor to tolerate planned gemcitabine/docetaxol. The preferred treatment with Adriamycin could not be administered given her cardiac diastolic dysfunction. It was requested that the patient see her primary care practitioner to assist with management of the hyponatremia hypothyroidism and also to improve her functional status for at least 2 weeks with a plan to reassess to see if her performance status would support chemotherapy.  She then sought a second opinion at [facility] with Dr. Mellody Drown the plan was for her to receive cycle 1 of taxol carboplatin on 01/02/2017.   Another issue has been progressive worsening of bilateral lower extremity edema. All Doppler studies to date have been negative. Cellulitis was appreciated during the last admission and treated with chemotherapy with improvement. She is discharged home on doxycycline and wrapping of the foot strongly encouraged.    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 Rx lumpectomy followed by radiation.      03/15/2015 -  Presenting Symptoms    Abnormal L mammogram with pleomorphic calcifications L  LIQ.      03/23/2015 Biopsy    L breast core biopsy at [facility]. Path: IDC, gr 2, ER+(100%), PR negative, HER2 over-expressed by  [facility]      06/02/2015 Surgery    L breast NL partial mastectomy with SLN by Dr Coralie Keens at [facility]. Path: IDC, gr 3, 1.5 cm with DCIS. +Posterior margin. 0/1 SLN.      06/30/2015 Surgery    Re-excision of posterior-inferior margin by Dr Ninfa Linden. Path: DCIS present <11m. Skletal muscle present and negative for carcinoma.      07/13/2015 Initial Diagnosis    Malignant neoplasm of lower-inner quadrant of left female breast (RAF-HCC)      07/21/2015 -  Other    MDC recs: Re-presented today. After meeting Drs Carey/Gallagher/Gupta, she is amenable to mastectomy and TH. Will return to see [facility] for discussion of mastectomy and DIEP recon. Will proceed with systemic therapy 1st. Delay mast/recon.      07/21/2015 - 10/13/2015 Chemotherapy    TH x 12 complete. Continuing adjuvant herceptin      11/2015 Endocrine/Hormone Therapy    Femara initiated      Past Medical History:       Past Medical History:  Diagnosis Date  . Breast cancer of upper-inner quadrant of left female breast (CMS-HCC) 2017  . Breast cancer of upper-outer quadrant of left female breast (CMS-HCC) 2010   Past Surgical History:        Past Surgical History:  Procedure Laterality Date  . BREAST BIOPSY Left    cancer  . BREAST LUMPECTOMY Left 2010  . BREAST LUMPECTOMY Left 2017  . HERNIA REPAIR  1958  . PR EXPLORATORY OF ABDOMEN N/A 12/06/2016   Procedure: Exploratory Laparotomy, Exploratory Celiotomy With Or Without Biopsy(S); Surgeon: WTamela Gammon MD; [facility] Service: Gynecology Oncology   Family History:        Family History  Problem Relation Age of Onset  . Breast cancer Paternal Aunt 539  died from lung CA  . Kidney cancer Mother   . Leukemia Paternal Aunt   . Lung cancer Paternal Uncle    x 2  . No Known Problems Father   . No Known Problems Sister   . Hypothyroidism Daughter   . No Known Problems Maternal Grandmother   . No Known Problems Maternal  Grandfather   . No Known Problems Paternal Grandmother   . No Known Problems Paternal Grandfather   . BRCA 1/2 Neg Hx   . Cancer Neg Hx   . Colon cancer Neg Hx   . Endometrial cancer Neg Hx   . Ovarian cancer Neg Hx     Physical Examination: BP (!) 68/45   Pulse 83   Temp (!) 94.8 F (34.9 C) (Rectal)   Resp 18   Ht _0  (1.727 m)   Wt 241 lb (109.3 kg)   SpO2 100%   BMI 36.64 kg/m   Cachectic female in NAD, responds non verbally to questions.  Surrounded by her attentive family. Abd:  Distended, tense, incision C/D/I induration at the base of the incision. CBC    Component Value Date/Time   WBC 21.4 (H) 12/28/2016 0500   RBC 2.45 (L) 12/28/2016 0500   HGB 7.2 (L) 12/28/2016 0500   HGB 13.1 10/19/2009 1427   HCT 21.3 (L) 12/28/2016 0500   HCT 38.3 10/19/2009 1427   PLT 451 (H) 12/28/2016 0500   PLT 223 10/19/2009 1427  MCV 86.9 12/28/2016 0500   MCV 92.3 10/19/2009 1427   MCH 29.4 12/28/2016 0500   MCHC 33.8 12/28/2016 0500   RDW 17.7 (H) 12/28/2016 0500   RDW 14.4 10/19/2009 1427   LYMPHSABS 1.8 12/24/2016 2049   LYMPHSABS 1.5 10/19/2009 1427   MONOABS 0.3 12/30/2016 2049   MONOABS 0.5 10/19/2009 1427   EOSABS 0.0 12/18/2016 2049   EOSABS 0.2 10/19/2009 1427   BASOSABS 0.0 12/26/2016 2049   BASOSABS 0.0 10/19/2009 1427    CMP Latest Ref Rng & Units 12/28/2016 12/28/2016 12/28/2016  Glucose 65 - 99 mg/dL 130(H) 88 92  BUN 6 - 20 mg/dL 95(H) 94(H) 97(H)  Creatinine 0.44 - 1.00 mg/dL 3.90(H) 4.20(H) 4.03(H)  Sodium 135 - 145 mmol/L 127(L) 125(L) 128(L)  Potassium 3.5 - 5.1 mmol/L 6.7(HH) 6.9(HH) 7.0(HH)  Chloride 101 - 111 mmol/L 100(L) 101 100(L)  CO2 22 - 32 mmol/L 14(L) - 15(L)  Calcium 8.9 - 10.3 mg/dL 8.0(L) - 8.8(L)  Total Protein 6.5 - 8.1 g/dL - - -  Total Bilirubin 0.3 - 1.2 mg/dL - - -  Alkaline Phos 38 - 126 U/L - - -  AST 15 - 41 U/L - - -  ALT 14 - 54 U/L - - -    Assessment/Plan: LUCCIA REINHEIMER is a 71 y.o. woman with a poorly  differentiated malignant neoplasm with spindle cell features diagnosed at [facility]. Based on imaging and pathology, we favor a sarcoma. NADIYAH ZEIS was seen in this sarcoma clinic by Dr. Denman George however her performance status was too poor for chemotherapy. Mrs. Sybert  sought a second opinion at [facility] with Dr. Mellody Drown  and his  plan is for taxol/carboplatin therapy to begin on Monday.    This morning I received a call from her daughter requesting transfer for surgical removal of the mass. I again explained why that was not feasible and reiterated the point that her mother's condition is deteriorating and that she should BE open to supportive care given her worsening status.  At this point Almeta Monas and her family wish to proceed with chemotherapy  treatment.

## 2016-12-28 NOTE — ED Notes (Signed)
Dr. Rolan Lipa reported that patient needed to be transported to [facility] ED to wait for a stepdown bed. Spoke with Shanon Brow, RN (Camera operator) at [employer]. At the current time, he will need to call back due to census of the department and speaking with Dr. Leonides Schanz. Spoke with Karna Christmas, RN (Camera operator), she reports that [facility] will hold patient until an assigned room is confirmed for direct transport to the floor.

## 2016-12-28 NOTE — ED Notes (Signed)
Attempted lab draw x 2 for blood culture but unsuccessful.

## 2016-12-28 NOTE — Progress Notes (Addendum)
This is a no charge note  71 year old lady with past medical history of prostate cancer, large pelvic mass, who presents with urinary retention, acute renal injury with creatinine up to 3.9, hyperkalemia with potassium 7.4 and EKG change. CT scan showed possible partial small bowel obstruction versus ileus, therefore patient is not a good candidate for being treated with Kayexalate for hyperkalemia. Patient was treated with calcium gluconate plus insulin twice. Potassium decreased from 7.4-6.9. Will give another dose of Lasix 40 mg by IV. I spoke with nephrologist, Dr. Augustin Coupe who thinks pt may need HD and line placement. I consulted PCCM, Dr. Mortimer Fries agree to admit pt to ICU in [facility]. Dr. Augustin Coupe also recommended to measure blood pressure to rule out compartment syndrome.  Ivor Costa, MD  [facility] Pager [phone]  If 7PM-7AM, please contact night-coverage [email or web address] Password [email or web address] 12/28/2016, 5:12 AM

## 2016-12-28 NOTE — Progress Notes (Signed)
eLink Physician-Brief Progress Note Patient Name: Ana Woodward DOB: 1945-10-09 MRN: [id number]   Date of Service  12/28/2016  HPI/Events of Note  71 yo large pelvic mass with ARF with Hperkalemia  eICU Interventions  Will likely need emergent HD as per nephrology Admit to PCCM from [facility] ED due to services needed for patient     Intervention Category Evaluation Type: New Patient Evaluation  Amy Gothard 12/28/2016, 4:59 AM

## 2016-12-28 NOTE — ED Notes (Signed)
Phone report called to [place], Therapist, sports

## 2016-12-28 NOTE — Consult Note (Signed)
Reason for Consult: Acute Renal Failure Referring Physician: Dr. Blaine Hamper  Chief Complaint: Cellulitis and Abdominal Pain  Assessment/Plan: 1. Abdominal Pain related to the large mass in the pelvis and lower abdomen which is actually displacing the bladder into the posterior inferior pelvis + partial SBO. 2. Acute Kidney Injury - By history renal function was 0.9 on 12/21/2016 during evaluation at [facility]. Only 1m output with in and out cath prior to placing a Foley but CT mentions moderately distended bladder. Suspicious for abdominal compartment syndrome. DDx; ACS vs ATN vs postobstructive (in the setting of ACS), less likely to be membranous associated with a solid malignancy. I doubt this is prerenal but will check a FeNA in the setting of oliguria. - Bladder pressures --> if greater than 265mHg then may need decompression. - With the AKI and hyperkalemia --> kayexalate of limited efficacy with the possible GI obstruction and e/o SBO. She's already been given protcocol. - Plan on challenging with HD w/ low threshold for converting to CVVHD. 3. 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 Biopsy on 12/06/16 during the ex-lap sarcoma (poorly differentitated) vs carcinosarcoma vs poorly differentiated carcinoma in the setting of a limited sampling. 4. H/O Breast Cancer - chemotherapy from 08/2015-08/2016 s/p left lumpectomy in 2010 and partial mastectomy in 05/2015.    HPI: Ana Woodward an [age].o. 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 CT showed a 25x17x19cm mass arising from the left adnexa, large volume ascites and peritoneal studding in the pelvis. [facility]. She  had an ex lap w/ bipsy of mass, removal of ascites and excision of umbilical hernia on 11/11/89/79The mass could not be removed as it was extending to the B/L pelvic side walls extending to the upper abdomen with large vessels over the surface. 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 She's presenting with cellulitis in the lower extremities and abdominal fullness w/ last [facility]from the right [id number]. She was found to be in AKI with hyperkalemia; she only uses Tylenol for pain and has not taken the NSAID that's listed in her home meds. She also denies any sob, cp, n/v, f. She has noted decreased UOP and has had fullness in the abdomen.   ROS Pertinent items are noted in HPI.  Chemistry and CBC: Creatinine, Ser  Date/Time Value Ref Range Status  12/28/2016 01:25 AM 4.20 (H) 0.44 - 1.00 mg/dL Final  12/28/2016 01:19 AM 4.03 (H) 0.44 - 1.00 mg/dL Final  12/11/2016 08:49 PM 3.90 (H) 0.44 - 1.00 mg/dL Final  10/19/2009 02:27 PM 0.82 0.40 - 1.20 mg/dL Final  11/18/2008 10:19 AM 1.05 0.40 - 1.20 mg/dL Final  11/04/2008 09:36 AM 0.91 0.4 - 1.2 mg/dL Final    Recent Labs Lab 12/15/2016 2049 12/28/16 0119 12/28/16 0125  NA 128* 128* 125*  K 7.4* 7.0* 6.9*  CL 98* 100* 101  CO2 14* 15*  --   GLUCOSE 115* 92 88  BUN 97* 97* 94*  CREATININE 3.90* 4.03* 4.20*  CALCIUM 8.9 8.8*  --     Recent Labs Lab 01/05/2017 2049 12/28/16  0125  WBC 20.4*  --   NEUTROABS 18.3*  --   HGB 8.9* 9.5*  HCT 26.4* 28.0*  MCV 85.4  --   PLT 488*  --    Liver Function Tests:  Recent Labs Lab 12/11/2016 2049  AST 26  ALT 16  ALKPHOS 62  BILITOT 0.5  PROT 5.7*  ALBUMIN 2.7*    Recent Labs Lab 12/13/2016 2049  LIPASE 18   No results for input(s): AMMONIA in the last 168 hours. Cardiac Enzymes: No results for input(s): CKTOTAL, CKMB, CKMBINDEX, TROPONINI in the last 168 hours. Iron Studies: No  results for input(s): IRON, TIBC, TRANSFERRIN, FERRITIN in the last 72 hours. PT/INR: _0 (inr:5)  Xrays/Other Studies: ) Results for orders placed or performed during the hospital encounter of 01/07/2017 (from the past 48 hour(s))  CBC with Differential     Status: Abnormal   Collection Time: 12/23/2016  8:49 PM  Result Value Ref Range   WBC 20.4 (H) 4.0 - 10.5 K/uL   RBC 3.09 (L) 3.87 - 5.11 MIL/uL   Hemoglobin 8.9 (L) 12.0 - 15.0 g/dL   HCT 26.4 (L) 36.0 - 46.0 %   MCV 85.4 78.0 - 100.0 fL   MCH 28.8 26.0 - 34.0 pg   MCHC 33.7 30.0 - 36.0 g/dL   RDW 17.7 (H) 11.5 - 15.5 %   Platelets 488 (H) 150 - 400 K/uL   Neutrophils Relative % 89 %   Neutro Abs 18.3 (H) 1.7 - 7.7 K/uL   Lymphocytes Relative 9 %   Lymphs Abs 1.8 0.7 - 4.0 K/uL   Monocytes Relative 2 %   Monocytes Absolute 0.3 0.1 - 1.0 K/uL   Eosinophils Relative 0 %   Eosinophils Absolute 0.0 0.0 - 0.7 K/uL   Basophils Relative 0 %   Basophils Absolute 0.0 0.0 - 0.1 K/uL  Comprehensive metabolic panel     Status: Abnormal   Collection Time: 12/15/2016  8:49 PM  Result Value Ref Range   Sodium 128 (L) 135 - 145 mmol/L   Potassium 7.4 (HH) 3.5 - 5.1 mmol/L    Comment: REPEATED TO VERIFY NO VISIBLE HEMOLYSIS CRITICAL RESULT CALLED TO, READ BACK BY AND VERIFIED WITHLoletha Grayer [facility] RN 2222 12/29/2016 A NAVARRO    Chloride 98 (L) 101 - 111 mmol/L   CO2 14 (L) 22 - 32 mmol/L   Glucose, Bld 115 (H) 65 - 99 mg/dL   BUN 97 (H) 6 - 20 mg/dL   Creatinine, Ser 3.90 (H) 0.44 - 1.00 mg/dL   Calcium 8.9 8.9 - 10.3 mg/dL   Total Protein 5.7 (L) 6.5 - 8.1 g/dL   Albumin 2.7 (L) 3.5 - 5.0 g/dL   AST 26 15 - 41 U/L   ALT 16 14 - 54 U/L   Alkaline Phosphatase 62 38 - 126 U/L   Total Bilirubin 0.5 0.3 - 1.2 mg/dL   GFR calc non Af Amer 11 (L) >60 mL/min   GFR calc Af Amer 13 (L) >60 mL/min    Comment: (NOTE) The eGFR has been calculated using the CKD EPI equation. This calculation has not been validated in all clinical  situations. eGFR's persistently <60 mL/min signify possible Chronic Kidney Disease.    Anion gap 16 (H) 5 - 15  Lipase, blood     Status: None   Collection Time: 12/26/2016  8:49 PM  Result Value Ref Range   Lipase 18 11 - 51 U/L  I-Stat CG4 Lactic Acid, ED  Status: Abnormal   Collection Time: 12/26/2016  9:36 PM  Result Value Ref Range   Lactic Acid, Venous 1.95 (H) 0.5 - 1.9 mmol/L  Protime-INR     Status: None   Collection Time: 12/21/2016  9:47 PM  Result Value Ref Range   Prothrombin Time 14.7 11.4 - 15.2 seconds   INR 1.16   Urinalysis, Routine w reflex microscopic- may I&O cath if menses     Status: Abnormal   Collection Time: 01/02/2017  9:50 PM  Result Value Ref Range   Color, Urine AMBER (A) YELLOW    Comment: BIOCHEMICALS MAY BE AFFECTED BY COLOR   APPearance TURBID (A) CLEAR   Specific Gravity, Urine 1.024 1.005 - 1.030   pH 5.0 5.0 - 8.0   Glucose, UA 50 (A) NEGATIVE mg/dL   Hgb urine dipstick SMALL (A) NEGATIVE   Bilirubin Urine NEGATIVE NEGATIVE   Ketones, ur 5 (A) NEGATIVE mg/dL   Protein, ur 30 (A) NEGATIVE mg/dL   Nitrite NEGATIVE NEGATIVE   Leukocytes, UA TRACE (A) NEGATIVE   RBC / HPF 0-5 0 - 5 RBC/hpf   WBC, UA 0-5 0 - 5 WBC/hpf   Bacteria, UA RARE (A) NONE SEEN   Squamous Epithelial / LPF 0-5 (A) NONE SEEN  Blood gas, venous     Status: Abnormal   Collection Time: 12/17/2016 10:38 PM  Result Value Ref Range   O2 Content ROOM AIR L/min   Delivery systems ROOM AIR    pH, Ven 7.248 (L) 7.250 - 7.430   pCO2, Ven 33.2 (L) 44.0 - 60.0 mmHg   pO2, Ven 188.0 (H) 32.0 - 45.0 mmHg   Bicarbonate 14.0 (L) 20.0 - 28.0 mmol/L   Acid-base deficit 11.9 (H) 0.0 - 2.0 mmol/L   O2 Saturation 99.0 %   Patient temperature 98.6    Collection site VEIN    Drawn by COLLECTED BY LABORATORY    Sample type VENOUS   I-Stat CG4 Lactic Acid, ED     Status: Abnormal   Collection Time: 12/28/16 12:18 AM  Result Value Ref Range   Lactic Acid, Venous 3.22 (HH) 0.5 - 1.9 mmol/L    Comment NOTIFIED PHYSICIAN   Basic metabolic panel     Status: Abnormal   Collection Time: 12/28/16  1:19 AM  Result Value Ref Range   Sodium 128 (L) 135 - 145 mmol/L   Potassium 7.0 (HH) 3.5 - 5.1 mmol/L    Comment: NO VISIBLE HEMOLYSIS CRITICAL RESULT CALLED TO, READ BACK BY AND VERIFIED WITH: C DOSTER RN 0201 12/28/16 A NAVARRO    Chloride 100 (L) 101 - 111 mmol/L   CO2 15 (L) 22 - 32 mmol/L   Glucose, Bld 92 65 - 99 mg/dL   BUN 97 (H) 6 - 20 mg/dL   Creatinine, Ser 4.03 (H) 0.44 - 1.00 mg/dL   Calcium 8.8 (L) 8.9 - 10.3 mg/dL   GFR calc non Af Amer 10 (L) >60 mL/min   GFR calc Af Amer 12 (L) >60 mL/min    Comment: (NOTE) The eGFR has been calculated using the CKD EPI equation. This calculation has not been validated in all clinical situations. eGFR's persistently <60 mL/min signify possible Chronic Kidney Disease.    Anion gap 13 5 - 15  Brain natriuretic peptide     Status: Abnormal   Collection Time: 12/28/16  1:19 AM  Result Value Ref Range   B Natriuretic Peptide 120.4 (H) 0.0 - 100.0 pg/mL  I-Stat Chem 8, ED  Status: Abnormal   Collection Time: 12/28/16  1:25 AM  Result Value Ref Range   Sodium 125 (L) 135 - 145 mmol/L   Potassium 6.9 (HH) 3.5 - 5.1 mmol/L   Chloride 101 101 - 111 mmol/L   BUN 94 (H) 6 - 20 mg/dL   Creatinine, Ser 4.20 (H) 0.44 - 1.00 mg/dL   Glucose, Bld 88 65 - 99 mg/dL   Calcium, Ion 1.17 1.15 - 1.40 mmol/L   TCO2 15 (L) 22 - 32 mmol/L   Hemoglobin 9.5 (L) 12.0 - 15.0 g/dL   HCT 28.0 (L) 36.0 - 46.0 %   Comment NOTIFIED PHYSICIAN   APTT     Status: None   Collection Time: 12/28/16  4:49 AM  Result Value Ref Range   aPTT 25 24 - 36 seconds   Ct Abdomen Pelvis Wo Contrast  Result Date: 12/13/2016 CLINICAL DATA:  Pt was dx with a pelvic mass at the beginning of August, she has surgery at the end of August, the mass was unable to be removed and she's suppose to start chemo next week at [facility] Today pt complains of urinary retention since 9am  yesterday, abdomen is distended and tight, weeping cellulitis in both lower extremities. EXAM: CT ABDOMEN AND PELVIS WITHOUT CONTRAST TECHNIQUE: Multidetector CT imaging of the abdomen and pelvis was performed following the standard protocol without IV contrast. COMPARISON:  None. FINDINGS: Evaluation is difficult without IV or oral contrast. Lower chest: Mild bibasilar atelectasis. Hepatobiliary: Liver is unremarkable. Gallbladder is not seen, presumably compressed by adjacent mass and/or bowel. No intrahepatic bile duct dilatation appreciated. Pancreas: Compressed by adjacent bowel but otherwise unremarkable. Spleen: Normal in size without focal abnormality. Adrenals/Urinary Tract: Adrenal glands are unremarkable. Kidneys are unremarkable without mass, stone or hydronephrosis. No perinephric fluid. Bladder is obscured by the pelvic mass and surrounding bowel loops, presumably displaced to the posterior pelvis and moderately distended. Stomach/Bowel: Visualized portions of the large bowel appear decompressed. Majority of the small bowel is displaced into the upper abdomen, fluid-filled and at least mildly distended suggesting partial obstruction or ileus. Vascular/Lymphatic: No significant vascular findings are present. No enlarged abdominal or pelvic lymph nodes. Reproductive: Obscured by the pelvic mass. Other: Massive complex cystic and solid mass occupying the majority of the pelvis and lower abdomen, with a probable cystic component extending into the left upper quadrant, overall measuring at least 26 cm craniocaudal dimension, 29 cm transverse dimension and 24 cm AP dimension. As above, the mass appears to be displacing the moderately distended bladder into the posterior pelvis. Musculoskeletal: Degenerative change throughout the scoliotic thoracolumbar spine, mild to moderate in degree. No acute or suspicious osseous finding. Ill-defined fluid/edema within the subcutaneous soft tissues of the lower abdomen  and pelvis indicating anasarca. IMPRESSION: 1. Massive complex cystic and solid mass occupying the majority of the pelvis and lower abdomen, probable cystic component extending into the left upper quadrant, overall measuring at least 26 cm craniocaudal dimension, 29 cm transverse dimension and 24 cm AP dimension. 2. Mass appears to be displacing the moderately distended bladder into the posterior inferior pelvis. 3. Moderately distended fluid-filled loops of small bowel displaced into the upper abdomen, indicating partial small bowel obstruction or ileus. Large bowel is relatively decompressed, some portions of the bowel are difficult to see due to the intra-abdominal mass. 4. Anasarca. Electronically Signed   By: Franki Cabot M.D.   On: 12/30/2016 23:04   Dg Chest Port 1 View  Result Date: 12/26/2016 CLINICAL DATA:  Leukocytosis.  Shortness of breath. EXAM: PORTABLE CHEST 1 VIEW COMPARISON:  06/30/2015 FINDINGS: Right central venous catheter with tip over the low SVC region. No pneumothorax. Shallow inspiration. Mild cardiac enlargement. No vascular congestion or edema. No consolidation or airspace disease. Degenerative changes in the spine and shoulders. Mild thoracolumbar scoliosis convex towards the right. IMPRESSION: Cardiac enlargement. Shallow inspiration. No evidence of active pulmonary disease. Electronically Signed   By: Lucienne Capers M.D.   On: 01/02/2017 22:56    PMH:   Past Medical History:  Diagnosis Date  . Asthma    as child, no problems now  . Breast cancer ([place])    left breast cancer 2010-lumpectomy, radiation  . H/O bone density study 2010  . H/O colonoscopy 2016  . History of breast cancer   . Pap smear for cervical cancer screening 2010  . Radiation 02/01/09-03/22/09   left breast 5040 cGy  . Seasonal allergies   . Snores     PSH:   Past Surgical History:  Procedure Laterality Date  . BREAST BIOPSY Left 03/23/15  . BREAST LUMPECTOMY  11/06/2008   left with  sentinel node biopsy  . BREAST LUMPECTOMY WITH RADIOACTIVE SEED AND SENTINEL LYMPH NODE BIOPSY Left 06/02/2015   Procedure: LEFT BREAST LUMPECTOMY WITH RADIOACTIVE SEED AND SENTINEL LYMPH NODE BIOPSY;  Surgeon: Coralie Keens, MD;  Location: [place];  Service: General;  Laterality: Left;  . EYE SURGERY  10/13/2011, 11/10/2011   right and left  . HERNIA REPAIR Right 1959  . MASS EXCISION N/A 06/02/2015   Procedure: EXCISION BACK  MASS;  Surgeon: Coralie Keens, MD;  Location: [place];  Service: General;  Laterality: N/A;  . PORTACATH PLACEMENT Right 06/30/2015   Procedure: INSERTION PORT-A-CATH;  Surgeon: Coralie Keens, MD;  Location: [place];  Service: General;  Laterality: Right;  . RE-EXCISION OF BREAST LUMPECTOMY Left 06/30/2015   Procedure: RE-EXCISION OFLEFT BREAST CANCER;  Surgeon: Coralie Keens, MD;  Location: [place];  Service: General;  Laterality: Left;    Allergies:  Allergies  Allergen Reactions  . Codeine Nausea And Vomiting  . Vicodin [Hydrocodone-Acetaminophen] Nausea And Vomiting    Medications:   Prior to Admission medications   Medication Sig Start Date End Date Taking? Authorizing Provider  acetaminophen (TYLENOL) 650 MG CR tablet Take 1,300 mg by mouth every 6 (six) hours as needed for pain.   Yes [provider]  alendronate (FOSAMAX) 70 MG tablet Take 70 mg by mouth every 7 (seven) days. On sundays 10/05/16 10/05/17 Yes [provider]  doxycycline (VIBRAMYCIN) 100 MG capsule Take 1 capsule by mouth 2 (two) times daily. 12/26/16  Yes [provider]  enoxaparin (LOVENOX) 40 MG/0.4ML injection Inject 40 mg into the skin daily. 12/14/16  Yes [provider]  letrozole (FEMARA) 2.5 MG tablet Take 1 tablet by mouth daily. 10/05/16 10/05/17 Yes [provider]  levothyroxine (SYNTHROID, LEVOTHROID) 112 MCG tablet Take 1 tablet by mouth daily. 12/14/16 01/13/17 Yes  [provider]  sodium chloride 1 g tablet Take 1 g by mouth 3 (three) times daily. 12/15/16 01/14/17 Yes [provider]  spironolactone (ALDACTONE) 50 MG tablet Take 50 mg by mouth daily.   Yes [provider]  traMADol (ULTRAM) 50 MG tablet Take 1-2 tablets (50-100 mg total) by mouth every 6 (six) hours as needed. Patient not taking: Reported on 12/28/2016 06/30/15   Coralie Keens, MD    Discontinued Meds:   Medications Discontinued  During This Encounter  Medication Reason  . sodium chloride 0.9 % bolus 1,000 mL   . BROCCOLI EXTRACT PO Patient Preference  . Cod Liver Oil 1000 MG CAPS Patient Preference  . Coenzyme Q10 (COQ10) 100 MG CAPS Patient Preference  . IODINE, KELP, PO Patient Preference  . loratadine (CLARITIN) 10 MG tablet Patient Preference  . Turmeric 500 MG CAPS Patient Preference  . albuterol (PROVENTIL,VENTOLIN) solution continuous neb   . insulin aspart (novoLOG) injection 5 Units   . dextrose 50 % solution 50 mL   . furosemide (LASIX) injection 20 mg     Social History:  reports that she has never smoked. She has never used smokeless tobacco. She reports that she does not drink alcohol or use drugs.  Family History:   Family History  Problem Relation Age of Onset  . Heart murmur Mother   . Diabetes type II Mother   . Kidney cancer Mother   . Heart disease Mother   . Cancer Mother        tumor on right kidney  . Breast cancer Paternal Aunt   . Cancer Paternal Aunt        breast  . Colon cancer Maternal Uncle     Blood pressure 91/63, pulse 69, temperature (!) 96.3 F (35.7 C), temperature source Axillary, resp. rate (!) 27, height _0  (1.727 m), weight 109.3 kg (241 lb), SpO2 97 %. General appearance: alert, cooperative and appears older than stated age Head: Normocephalic, without obvious abnormality, atraumatic Eyes: negative Neck: no adenopathy, no carotid bruit, supple, symmetrical, trachea midline and thyroid not  enlarged, symmetric, no tenderness/mass/nodules Back: symmetric, no curvature. ROM normal. No CVA tenderness. Resp: clear to auscultation bilaterally Chest wall: no tenderness Cardio: regular rate and rhythm, S1, S2 normal, no murmur, click, rub or gallop GI: distended and firm Extremities: edema 2+ Pulses: 2+ and symmetric Skin: Skin color, texture, turgor normal. No rashes or lesions Lymph nodes: Cervical, supraclavicular, and axillary nodes normal. Neurologic: Grossly normal       LIN, Hunt Oris, MD 12/28/2016, 5:43 AM

## 2016-12-28 NOTE — Progress Notes (Signed)
Dr Titus Mould called to check on patient and report that at this time [facility] does not have an ICU bed. Once the patient is stable and able to transfer to a floor he will reach out to [facility] for a transfer. Daughter notified and verbalized understanding that the transfer is pending until the patient is stable.

## 2016-12-28 NOTE — Consult Note (Signed)
Reason for Consult: Rule out abdominal compartment syndrome Referring Physician: Odette Fraction is an 71 y.o. female.  HPI: Unfortunate 71 year old female with an aggressive, very large peritoneal adenocarcinoma. This abdominal tumor was biopsied at [facility]. She plans chemotherapy to begin at [facility] this coming Monday. Unfortunately, it has grown much larger over the past several weeks. She presented with acute kidney injury and we are asked to rule out abdominal compartment syndrome.  Past Medical History:  Diagnosis Date  . Asthma    as child, no problems now  . Breast cancer ([place])    left breast cancer 2010-lumpectomy, radiation  . H/O bone density study 2010  . H/O colonoscopy 2016  . History of breast cancer   . Pap smear for cervical cancer screening 2010  . Radiation 02/01/09-03/22/09   left breast 5040 cGy  . Seasonal allergies   . Snores     Past Surgical History:  Procedure Laterality Date  . BREAST BIOPSY Left 03/23/15  . BREAST LUMPECTOMY  11/06/2008   left with sentinel node biopsy  . BREAST LUMPECTOMY WITH RADIOACTIVE SEED AND SENTINEL LYMPH NODE BIOPSY Left 06/02/2015   Procedure: LEFT BREAST LUMPECTOMY WITH RADIOACTIVE SEED AND SENTINEL LYMPH NODE BIOPSY;  Surgeon: Coralie Keens, MD;  Location: [place];  Service: General;  Laterality: Left;  . EYE SURGERY  10/13/2011, 11/10/2011   right and left  . HERNIA REPAIR Right 1959  . MASS EXCISION N/A 06/02/2015   Procedure: EXCISION BACK  MASS;  Surgeon: Coralie Keens, MD;  Location: [place];  Service: General;  Laterality: N/A;  . PORTACATH PLACEMENT Right 06/30/2015   Procedure: INSERTION PORT-A-CATH;  Surgeon: Coralie Keens, MD;  Location: [place];  Service: General;  Laterality: Right;  . RE-EXCISION OF BREAST LUMPECTOMY Left 06/30/2015   Procedure: RE-EXCISION OFLEFT BREAST CANCER;  Surgeon: Coralie Keens, MD;  Location:  [place];  Service: General;  Laterality: Left;    Family History  Problem Relation Age of Onset  . Heart murmur Mother   . Diabetes type II Mother   . Kidney cancer Mother   . Heart disease Mother   . Cancer Mother        tumor on right kidney  . Breast cancer Paternal Aunt   . Cancer Paternal Aunt        breast  . Colon cancer Maternal Uncle     Social History:  reports that she has never smoked. She has never used smokeless tobacco. She reports that she does not drink alcohol or use drugs.  Allergies:  Allergies  Allergen Reactions  . Codeine Nausea And Vomiting  . Vicodin [Hydrocodone-Acetaminophen] Nausea And Vomiting    Medications: I have reviewed the patient's current medications.  Results for orders placed or performed during the hospital encounter of 12/20/2016 (from the past 48 hour(s))  CBC with Differential     Status: Abnormal   Collection Time: 12/24/2016  8:49 PM  Result Value Ref Range   WBC 20.4 (H) 4.0 - 10.5 K/uL   RBC 3.09 (L) 3.87 - 5.11 MIL/uL   Hemoglobin 8.9 (L) 12.0 - 15.0 g/dL   HCT 26.4 (L) 36.0 - 46.0 %   MCV 85.4 78.0 - 100.0 fL   MCH 28.8 26.0 - 34.0 pg   MCHC 33.7 30.0 - 36.0 g/dL   RDW 17.7 (H) 11.5 - 15.5 %   Platelets 488 (H) 150 - 400 K/uL  Neutrophils Relative % 89 %   Neutro Abs 18.3 (H) 1.7 - 7.7 K/uL   Lymphocytes Relative 9 %   Lymphs Abs 1.8 0.7 - 4.0 K/uL   Monocytes Relative 2 %   Monocytes Absolute 0.3 0.1 - 1.0 K/uL   Eosinophils Relative 0 %   Eosinophils Absolute 0.0 0.0 - 0.7 K/uL   Basophils Relative 0 %   Basophils Absolute 0.0 0.0 - 0.1 K/uL  Comprehensive metabolic panel     Status: Abnormal   Collection Time: 12/10/2016  8:49 PM  Result Value Ref Range   Sodium 128 (L) 135 - 145 mmol/L   Potassium 7.4 (HH) 3.5 - 5.1 mmol/L    Comment: REPEATED TO VERIFY NO VISIBLE HEMOLYSIS CRITICAL RESULT CALLED TO, READ BACK BY AND VERIFIED WITHLoletha Grayer [facility] RN 2222 01/07/2017 A NAVARRO    Chloride 98 (L) 101 -  111 mmol/L   CO2 14 (L) 22 - 32 mmol/L   Glucose, Bld 115 (H) 65 - 99 mg/dL   BUN 97 (H) 6 - 20 mg/dL   Creatinine, Ser 3.90 (H) 0.44 - 1.00 mg/dL   Calcium 8.9 8.9 - 10.3 mg/dL   Total Protein 5.7 (L) 6.5 - 8.1 g/dL   Albumin 2.7 (L) 3.5 - 5.0 g/dL   AST 26 15 - 41 U/L   ALT 16 14 - 54 U/L   Alkaline Phosphatase 62 38 - 126 U/L   Total Bilirubin 0.5 0.3 - 1.2 mg/dL   GFR calc non Af Amer 11 (L) >60 mL/min   GFR calc Af Amer 13 (L) >60 mL/min    Comment: (NOTE) The eGFR has been calculated using the CKD EPI equation. This calculation has not been validated in all clinical situations. eGFR's persistently <60 mL/min signify possible Chronic Kidney Disease.    Anion gap 16 (H) 5 - 15  Lipase, blood     Status: None   Collection Time: 12/20/2016  8:49 PM  Result Value Ref Range   Lipase 18 11 - 51 U/L  I-Stat CG4 Lactic Acid, ED     Status: Abnormal   Collection Time: 12/15/2016  9:36 PM  Result Value Ref Range   Lactic Acid, Venous 1.95 (H) 0.5 - 1.9 mmol/L  Protime-INR     Status: None   Collection Time: 12/10/2016  9:47 PM  Result Value Ref Range   Prothrombin Time 14.7 11.4 - 15.2 seconds   INR 1.16   Urinalysis, Routine w reflex microscopic- may I&O cath if menses     Status: Abnormal   Collection Time: 12/26/2016  9:50 PM  Result Value Ref Range   Color, Urine AMBER (A) YELLOW    Comment: BIOCHEMICALS MAY BE AFFECTED BY COLOR   APPearance TURBID (A) CLEAR   Specific Gravity, Urine 1.024 1.005 - 1.030   pH 5.0 5.0 - 8.0   Glucose, UA 50 (A) NEGATIVE mg/dL   Hgb urine dipstick SMALL (A) NEGATIVE   Bilirubin Urine NEGATIVE NEGATIVE   Ketones, ur 5 (A) NEGATIVE mg/dL   Protein, ur 30 (A) NEGATIVE mg/dL   Nitrite NEGATIVE NEGATIVE   Leukocytes, UA TRACE (A) NEGATIVE   RBC / HPF 0-5 0 - 5 RBC/hpf   WBC, UA 0-5 0 - 5 WBC/hpf   Bacteria, UA RARE (A) NONE SEEN   Squamous Epithelial / LPF 0-5 (A) NONE SEEN  Blood gas, venous     Status: Abnormal   Collection Time: 12/12/2016 10:38  PM  Result Value Ref Range  O2 Content ROOM AIR L/min   Delivery systems ROOM AIR    pH, Ven 7.248 (L) 7.250 - 7.430   pCO2, Ven 33.2 (L) 44.0 - 60.0 mmHg   pO2, Ven 188.0 (H) 32.0 - 45.0 mmHg   Bicarbonate 14.0 (L) 20.0 - 28.0 mmol/L   Acid-base deficit 11.9 (H) 0.0 - 2.0 mmol/L   O2 Saturation 99.0 %   Patient temperature 98.6    Collection site VEIN    Drawn by COLLECTED BY LABORATORY    Sample type VENOUS   I-Stat CG4 Lactic Acid, ED     Status: Abnormal   Collection Time: 12/28/16 12:18 AM  Result Value Ref Range   Lactic Acid, Venous 3.22 (HH) 0.5 - 1.9 mmol/L   Comment NOTIFIED PHYSICIAN   Basic metabolic panel     Status: Abnormal   Collection Time: 12/28/16  1:19 AM  Result Value Ref Range   Sodium 128 (L) 135 - 145 mmol/L   Potassium 7.0 (HH) 3.5 - 5.1 mmol/L    Comment: NO VISIBLE HEMOLYSIS CRITICAL RESULT CALLED TO, READ BACK BY AND VERIFIED WITH: C DOSTER RN 0201 12/28/16 A NAVARRO    Chloride 100 (L) 101 - 111 mmol/L   CO2 15 (L) 22 - 32 mmol/L   Glucose, Bld 92 65 - 99 mg/dL   BUN 97 (H) 6 - 20 mg/dL   Creatinine, Ser 4.03 (H) 0.44 - 1.00 mg/dL   Calcium 8.8 (L) 8.9 - 10.3 mg/dL   GFR calc non Af Amer 10 (L) >60 mL/min   GFR calc Af Amer 12 (L) >60 mL/min    Comment: (NOTE) The eGFR has been calculated using the CKD EPI equation. This calculation has not been validated in all clinical situations. eGFR's persistently <60 mL/min signify possible Chronic Kidney Disease.    Anion gap 13 5 - 15  Brain natriuretic peptide     Status: Abnormal   Collection Time: 12/28/16  1:19 AM  Result Value Ref Range   B Natriuretic Peptide 120.4 (H) 0.0 - 100.0 pg/mL  I-Stat Chem 8, ED     Status: Abnormal   Collection Time: 12/28/16  1:25 AM  Result Value Ref Range   Sodium 125 (L) 135 - 145 mmol/L   Potassium 6.9 (HH) 3.5 - 5.1 mmol/L   Chloride 101 101 - 111 mmol/L   BUN 94 (H) 6 - 20 mg/dL   Creatinine, Ser 4.20 (H) 0.44 - 1.00 mg/dL   Glucose, Bld 88 65 - 99  mg/dL   Calcium, Ion 1.17 1.15 - 1.40 mmol/L   TCO2 15 (L) 22 - 32 mmol/L   Hemoglobin 9.5 (L) 12.0 - 15.0 g/dL   HCT 28.0 (L) 36.0 - 46.0 %   Comment NOTIFIED PHYSICIAN   Lactic acid, plasma     Status: Abnormal   Collection Time: 12/28/16  4:23 AM  Result Value Ref Range   Lactic Acid, Venous 3.3 (HH) 0.5 - 1.9 mmol/L    Comment: CRITICAL RESULT CALLED TO, READ BACK BY AND VERIFIED WITH: DOSTER,T RN 9.20.18 _0  ZANDO,C   Sedimentation rate     Status: None   Collection Time: 12/28/16  4:49 AM  Result Value Ref Range   Sed Rate 9 0 - 22 mm/hr  Procalcitonin     Status: None   Collection Time: 12/28/16  4:49 AM  Result Value Ref Range   Procalcitonin 2.09 ng/mL    Comment:        Interpretation: PCT > 2 ng/mL:  Systemic infection (sepsis) is likely, unless other causes are known. (NOTE)         ICU PCT Algorithm               Non ICU PCT Algorithm    ----------------------------     ------------------------------         PCT < 0.25 ng/mL                 PCT < 0.1 ng/mL     Stopping of antibiotics            Stopping of antibiotics       strongly encouraged.               strongly encouraged.    ----------------------------     ------------------------------       PCT level decrease by               PCT < 0.25 ng/mL       >= 80% from peak PCT       OR PCT 0.25 - 0.5 ng/mL          Stopping of antibiotics                                             encouraged.     Stopping of antibiotics           encouraged.    ----------------------------     ------------------------------       PCT level decrease by              PCT >= 0.25 ng/mL       < 80% from peak PCT        AND PCT >= 0.5 ng/mL            Continuing antibiotics                                               encouraged.       Continuing antibiotics            encouraged.    ----------------------------     ------------------------------     PCT level increase compared          PCT > 0.5 ng/mL         with peak  PCT AND          PCT >= 0.5 ng/mL             Escalation of antibiotics                                          strongly encouraged.      Escalation of antibiotics        strongly encouraged.   APTT     Status: None   Collection Time: 12/28/16  4:49 AM  Result Value Ref Range   aPTT 25 24 - 36 seconds  Basic metabolic panel     Status: Abnormal   Collection Time: 12/28/16  5:00 AM  Result Value Ref Range   Sodium 127 (L) 135 - 145 mmol/L   Potassium 6.7 (HH) 3.5 -  5.1 mmol/L    Comment: NO VISIBLE HEMOLYSIS CRITICAL RESULT CALLED TO, READ BACK BY AND VERIFIED WITH: DOSTER,T RN 9.20.18 _0  ZANDO,C    Chloride 100 (L) 101 - 111 mmol/L   CO2 14 (L) 22 - 32 mmol/L   Glucose, Bld 130 (H) 65 - 99 mg/dL   BUN 95 (H) 6 - 20 mg/dL   Creatinine, Ser 3.90 (H) 0.44 - 1.00 mg/dL   Calcium 8.0 (L) 8.9 - 10.3 mg/dL   GFR calc non Af Amer 11 (L) >60 mL/min   GFR calc Af Amer 13 (L) >60 mL/min    Comment: (NOTE) The eGFR has been calculated using the CKD EPI equation. This calculation has not been validated in all clinical situations. eGFR's persistently <60 mL/min signify possible Chronic Kidney Disease.    Anion gap 13 5 - 15  CBC     Status: Abnormal   Collection Time: 12/28/16  5:00 AM  Result Value Ref Range   WBC 21.4 (H) 4.0 - 10.5 K/uL   RBC 2.45 (L) 3.87 - 5.11 MIL/uL   Hemoglobin 7.2 (L) 12.0 - 15.0 g/dL    Comment: REPEATED TO VERIFY DELTA CHECK NOTED    HCT 21.3 (L) 36.0 - 46.0 %   MCV 86.9 78.0 - 100.0 fL   MCH 29.4 26.0 - 34.0 pg   MCHC 33.8 30.0 - 36.0 g/dL   RDW 17.7 (H) 11.5 - 15.5 %   Platelets 451 (H) 150 - 400 K/uL  Troponin I (q 6hr x 3)     Status: None   Collection Time: 12/28/16  5:20 AM  Result Value Ref Range   Troponin I <0.03 <0.03 ng/mL  Magnesium     Status: None   Collection Time: 12/28/16  5:20 AM  Result Value Ref Range   Magnesium 2.2 1.7 - 2.4 mg/dL  Phosphorus     Status: Abnormal   Collection Time: 12/28/16  5:20 AM  Result Value Ref  Range   Phosphorus 8.0 (H) 2.5 - 4.6 mg/dL  CK     Status: None   Collection Time: 12/28/16  5:20 AM  Result Value Ref Range   Total CK 74 38 - 234 U/L  Glucose, capillary     Status: Abnormal   Collection Time: 12/28/16  6:58 AM  Result Value Ref Range   Glucose-Capillary 126 (H) 65 - 99 mg/dL   Comment 1 Capillary Specimen    Comment 2 Notify RN     Ct Abdomen Pelvis Wo Contrast  Result Date: 12/12/2016 CLINICAL DATA:  Pt was dx with a pelvic mass at the beginning of August, she has surgery at the end of August, the mass was unable to be removed and she's suppose to start chemo next week at [facility] Today pt complains of urinary retention since 9am yesterday, abdomen is distended and tight, weeping cellulitis in both lower extremities. EXAM: CT ABDOMEN AND PELVIS WITHOUT CONTRAST TECHNIQUE: Multidetector CT imaging of the abdomen and pelvis was performed following the standard protocol without IV contrast. COMPARISON:  None. FINDINGS: Evaluation is difficult without IV or oral contrast. Lower chest: Mild bibasilar atelectasis. Hepatobiliary: Liver is unremarkable. Gallbladder is not seen, presumably compressed by adjacent mass and/or bowel. No intrahepatic bile duct dilatation appreciated. Pancreas: Compressed by adjacent bowel but otherwise unremarkable. Spleen: Normal in size without focal abnormality. Adrenals/Urinary Tract: Adrenal glands are unremarkable. Kidneys are unremarkable without mass, stone or hydronephrosis. No perinephric fluid. Bladder is obscured by the pelvic mass and surrounding bowel  loops, presumably displaced to the posterior pelvis and moderately distended. Stomach/Bowel: Visualized portions of the large bowel appear decompressed. Majority of the small bowel is displaced into the upper abdomen, fluid-filled and at least mildly distended suggesting partial obstruction or ileus. Vascular/Lymphatic: No significant vascular findings are present. No enlarged abdominal or pelvic lymph  nodes. Reproductive: Obscured by the pelvic mass. Other: Massive complex cystic and solid mass occupying the majority of the pelvis and lower abdomen, with a probable cystic component extending into the left upper quadrant, overall measuring at least 26 cm craniocaudal dimension, 29 cm transverse dimension and 24 cm AP dimension. As above, the mass appears to be displacing the moderately distended bladder into the posterior pelvis. Musculoskeletal: Degenerative change throughout the scoliotic thoracolumbar spine, mild to moderate in degree. No acute or suspicious osseous finding. Ill-defined fluid/edema within the subcutaneous soft tissues of the lower abdomen and pelvis indicating anasarca. IMPRESSION: 1. Massive complex cystic and solid mass occupying the majority of the pelvis and lower abdomen, probable cystic component extending into the left upper quadrant, overall measuring at least 26 cm craniocaudal dimension, 29 cm transverse dimension and 24 cm AP dimension. 2. Mass appears to be displacing the moderately distended bladder into the posterior inferior pelvis. 3. Moderately distended fluid-filled loops of small bowel displaced into the upper abdomen, indicating partial small bowel obstruction or ileus. Large bowel is relatively decompressed, some portions of the bowel are difficult to see due to the intra-abdominal mass. 4. Anasarca. Electronically Signed   By: Franki Cabot M.D.   On: 12/13/2016 23:04   Dg Chest Port 1 View  Result Date: 12/13/2016 CLINICAL DATA:  Leukocytosis.  Shortness of breath. EXAM: PORTABLE CHEST 1 VIEW COMPARISON:  06/30/2015 FINDINGS: Right central venous catheter with tip over the low SVC region. No pneumothorax. Shallow inspiration. Mild cardiac enlargement. No vascular congestion or edema. No consolidation or airspace disease. Degenerative changes in the spine and shoulders. Mild thoracolumbar scoliosis convex towards the right. IMPRESSION: Cardiac enlargement. Shallow  inspiration. No evidence of active pulmonary disease. Electronically Signed   By: Lucienne Capers M.D.   On: 12/30/2016 22:56    Review of Systems  Constitutional: Negative for fever.  HENT: Negative for hearing loss.   Eyes: Negative for blurred vision.  Respiratory: Negative for cough.   Cardiovascular: Negative for chest pain.  Gastrointestinal: Positive for abdominal pain and nausea. Negative for vomiting.  Genitourinary:       Decreased urine output  Musculoskeletal: Negative.   Skin: Negative.   Neurological: Negative for speech change and focal weakness.  Endo/Heme/Allergies: Negative.   Psychiatric/Behavioral: Positive for depression.   Blood pressure (!) 80/50, pulse 70, temperature (!) 97.3 F (36.3 C), temperature source Oral, resp. rate 19, height _0  (1.727 m), weight 109.3 kg (241 lb), SpO2 100 %. Physical Exam  Constitutional: She is oriented to person, place, and time.  Cachectic  HENT:  Head: Normocephalic.  Eyes: Pupils are equal, round, and reactive to light. EOM are normal.  Neck: Neck supple.  Cardiovascular: Normal rate and regular rhythm.   Respiratory: Effort normal and breath sounds normal. No respiratory distress. She has no wheezes.  GI: Soft.  Distended by an enormous intraperitoneal mass, no peritonitis  Musculoskeletal: She exhibits no edema.  Neurological: She is alert and oriented to person, place, and time. She exhibits normal muscle tone.  Skin: Skin is warm.  Psychiatric:  Appropriately depressed    Assessment/Plan: Massive peritoneal adenocarcinoma, unresectable. No abdominal compartment syndrome, abdominal compartment  syndrome is not caused by tumors. Acute kidney injury is likely obstructive due to the invasive nature of this tumor. Agree with proceeding with chemotherapy at [place] this coming Monday. Additionally, I would recommend a palliative evaluation. I spoke at length with her and her daughter.  Youssef Footman E 12/28/2016, 9:11  AM

## 2016-12-28 NOTE — Consult Note (Addendum)
[place] Nurse wound consult note Reason for Consult: Consult requested for left leg.  Pt has cellulitis and generalized edema to right leg; this has caused skin to blister and peel and has evolved into partal thickness skin loss.  Foot is cold to the touch with poor perfusion. Wound type: Left inner ankle with partial thickness wound; 1.5X1.5X.1cm, red and moist.  Left anterior calf with same appearance; 11X8X.1cm.   Drainage (amount, consistency, odor) Mod amt yellow drainage, no odor Dressing procedure/placement/frequency: Xeroform gauze to promote drying and healing to affected areas.  ABD pad and kerlex to absorb drainage.  Daughter at bedside to discuss plan of care.  Pt is high risk for breakdown of heels to occur related to poor perfusion and edema.  Ordered Prevalon boots to reduce pressure to this location. Please re-consult if further assistance is needed.  Thank-you,  Julien Girt MSN, [place], [place], [place], [place]

## 2016-12-28 NOTE — Procedures (Signed)
I have personally attended this patient's dialysis session. IJ line/current BFR 300   2K bath with pre-K still pending Pt dry ("CVP low by manual inspection") when central line placed so no UF BP drop 60's/neo started and BP ^80 with 100 neo  No volume off Adjust bath when K available  Jamal Maes, MD [place] Pager 12/28/2016, 3:51 PM

## 2016-12-28 NOTE — Progress Notes (Signed)
Intraabdominal pressure measured at 19. Nephrologist notified.

## 2016-12-28 NOTE — Progress Notes (Signed)
Pharmacy Antibiotic Note  Ana Woodward is a 71 y.o. female admitted on 12/23/2016 with cellulitis.  Pharmacy has been consulted for zosyn and vancomycin dosing.  Plan: Zosyn 3.375 Gm x1 then 2.25 Gm IV q6h Vancomycin 1 Gm x1 ED + 1 Gm = 2 Gm load then 1500 mg IV q48h Daily Scr F/u cultures/levels as needed  Height: 5\' 8"  (172.7 cm) Weight: 241 lb (109.3 kg) IBW/kg (Calculated) : 63.9  Temp (24hrs), Avg:96.9 F (36.1 C), Min:96.3 F (35.7 C), Max:97.4 F (36.3 C)   Recent Labs Lab 12/21/2016 2049 12/22/2016 2136 12/28/16 0018 12/28/16 0119 12/28/16 0125  WBC 20.4*  --   --   --   --   CREATININE 3.90*  --   --  4.03* 4.20*  LATICACIDVEN  --  1.95* 3.22*  --   --     Estimated Creatinine Clearance: 16.4 mL/min (A) (by C-G formula based on SCr of 4.2 mg/dL (H)).    Allergies  Allergen Reactions  . Codeine Nausea And Vomiting  . Vicodin [Hydrocodone-Acetaminophen] Nausea And Vomiting    Antimicrobials this admission: 9/20 zosyn >>  9/20 vancomycin >>   Dose adjustments this admission:   Microbiology results:  BCx:   UCx:    Sputum:    MRSA PCR:   Thank you for allowing pharmacy to be a part of this patient's care.  Dorrene German 12/28/2016 2:35 AM

## 2016-12-28 NOTE — Progress Notes (Signed)
[facility] transfer line called information on patient given and faxed requested information to the transfer line. Notified daughter that information was given and labs are pending post HD. Explained that patient safety for transfer will be consider once labs are obtained. Daughter verbalized understanding and states that she understands and agrees to the current plan.

## 2016-12-28 NOTE — Progress Notes (Signed)
Notes from Dr. Augustin Coupe, who saw patient overnight, reviewed. Patient with severe acute renal failure with normal GFR. Has rapidly expanding abdominal malignancy. She has a small bowel obstruction. She has hyperkalemia and home medications include spironolactone. Almost certainly the etiology of her renal failure is obstructive, especially after review of CT imaging. I'm not sure that her ureters are patent at this time. We will transduce a bladder pressure. Interestingly, her CT scan did not identify any hydronephrosis. She does currently have a Foley catheter in place with little urine.  Plans in place for a temporizing hemodialysis treatment. I have spoken with patient and family member expressing concern regarding her rapid deterioration and uncertainty that current therapies will be effective to restore her to health.  I discussed with critical care medicine. Plan for dialysis, at least once, later today. Await bladder pressure.

## 2016-12-28 NOTE — Progress Notes (Signed)
  Echocardiogram 2D Echocardiogram has been performed.  Ana Woodward 12/28/2016, 10:39 AM

## 2016-12-28 NOTE — Progress Notes (Signed)
PCCM INTERVAL PROGRESS NOTE  Asked to evaluate patient at bedside and update family. Ana Woodward is currently undergoing HD, but has no complaints with the exception of R heel pain. She is tachypneic, but denies distress. Daughter reports this breathing pattern is typical for her due to abdominal masses. She remains hypotensive on HD despite phenylephrine infusion, which is being up-titrated.   I have updated her family at bedside that contact has been made with [facility] for potential transfer assuming more stability is achieved with the completion of HD and the up-titration of pressors. They understand our goal is to hep her get to [facility] if she can safely make the trip. She remains full code.   Will plan to repeat CMP, CBC, Mag, Phos, and ABG after HD  Will reassess patient once labs result.   Additional critical care time 30 mins   Georgann Housekeeper, AGACNP-BC [facility] Pulmonology/Critical Care Pager [phone] or [phone]  12/28/2016 6:04 PM

## 2016-12-28 NOTE — ED Notes (Signed)
About 45 minutes prior, patient was complaining of epigastric, lower stereum pain. She described the pain as pressure with no radiation. Also, reported shortness of breath but no difference in pain with inspiration or exhalation. Patient requested oxygen. Applied 2L of oxygen via [place]. Informed Dr. Rolan Lipa at the time patients condition and complaint. Now, patient reports her shortness of breath has improved.

## 2016-12-28 NOTE — ED Notes (Signed)
Critical care provider notified of critical lactic acid and potassium levels.

## 2016-12-28 NOTE — Procedures (Signed)
Hemodialysis Insertion Procedure Note BENISHA HADAWAY [id number] Oct 04, 1945  Procedure: Insertion of Hemodialysis Catheter Type: 3 port  Indications: Hemodialysis   Procedure Details Consent: Risks of procedure as well as the alternatives and risks of each were explained to the (patient/caregiver).  Consent for procedure obtained. Time Out: Verified patient identification, verified procedure, site/side was marked, verified correct patient position, special equipment/implants available, medications/allergies/relevent history reviewed, required imaging and test results available.  Performed  Maximum sterile technique was used including antiseptics, cap, gloves, gown, hand hygiene, mask and sheet. Skin prep: Chlorhexidine; local anesthetic administered A antimicrobial bonded/coated triple lumen catheter was placed in the left internal jugular vein using the Seldinger technique. Ultrasound guidance used.Yes.   Catheter placed to 20 cm. Blood aspirated via all 3 ports and then flushed x 3. Line sutured x 2 and dressing applied.  Evaluation Blood flow good Complications: No apparent complications Patient did tolerate procedure well. Chest X-ray ordered to verify placement.  CXR: pending. Note: she is vascular dry.  Richardson Landry Minor ACNP Maryanna Shape PCCM Pager [phone] till 3 pm If no answer page [phone] 12/28/2016, 11:09 AM   US guidance  Emergent need  Lavon Paganini. Titus Mould, MD, [place] Pgr: [facility]

## 2016-12-28 NOTE — Progress Notes (Signed)
Patient ID: Ana Woodward, female   DOB: 1945/06/07, 71 y.o.   MRN: [id number] Events of day  [place] required for HD session ABg was repeated and Ph had improved HD about to finish Will repeat K, ABG as family requesting transfer to [place] I am concerned that she may not be STABLE for this transfer today We will investigate the repeat lytes and resp status post HD I also was updated by Dr Skeet Latch about her [facility] Bx and [employer]. She also updated me about her lack of candidacy from [facility] perspective for therapy Have asked NP Hoffman to update Daughter and pt and also assess Pt status I have called [facility] transfer Line and await response back from covering App or MD for Dr. Clarita Crane Service/.  Lavon Paganini. Titus Mould, MD, [place] Pgr: [place] [facility]

## 2016-12-28 NOTE — ED Notes (Signed)
Dr. Blaine Hamper being paged about critical potassium 7.0

## 2016-12-28 NOTE — H&P (Addendum)
..   Name: Ana Woodward MRN: [id number] DOB: 1945/05/07    ADMISSION DATE:  01/06/2017 CONSULTATION DATE:  12/28/16  REFERRING MD :  Gabriel Cirri  CHIEF COMPLAINT:  No Urine output >24 hrs & SOB   BRIEF PATIENT DESCRIPTION: 71 yr old female with a PMHx significant for breast cancer, 25x17x19cm pelvic mass on CT s/p biopsy presents hypotensive and SOB with oliguria, hyperkalemia and acidosis.   SIGNIFICANT EVENTS    STUDIES:  CT Abdomen/Pelvis CXR   HISTORY OF PRESENT ILLNESS: (History obtained from patient, daughter, and chart)  71 yr old female with PMHx of Breast cancer diagnosed in 2008 s/p lumpectomy s/p chemorads and had a period of remission until recurrence in 2016 s/p lumpectomy at that time followed by chemotherapy. During her workup she received a CT Abd/pelvis recealing a large pelvic mass She was referred to [facility] who scheduled patient for removal of mass but due to high vascularity of tumor only a biopsy was performed. Since then she has had a complicated course including symptomatic hyponatremia requiring a 3% NS IVF, bilateral lower extremity cellulitis for which she received 2 courses of antibiotics most recently doxycycline. She presents to [facility] with SOB, and a h/o >24 hrs of not making urine. At baseline she uses a walker around the house but in the last 48 hrs she has been unable to perform at her normal activity level per her daughter   PAST MEDICAL HISTORY :   has a past medical history of Asthma; Breast cancer ([place]); H/O bone density study (2010); H/O colonoscopy (2016); History of breast cancer; Pap smear for cervical cancer screening (2010); Radiation (02/01/09-03/22/09); Seasonal allergies; and Snores.  has a past surgical history that includes Breast lumpectomy (11/06/2008); Eye surgery (10/13/2011, 11/10/2011); Hernia repair (Right, 1959); Breast biopsy (Left, 03/23/15); Breast lumpectomy with radioactive seed and sentinel lymph node biopsy (Left, 06/02/2015); Mass  excision (N/A, 06/02/2015); Portacath placement (Right, 06/30/2015); and Re-excision of breast lumpectomy (Left, 06/30/2015). Prior to Admission medications   Medication Sig Start Date End Date Taking? Authorizing Provider  acetaminophen (TYLENOL) 650 MG CR tablet Take 1,300 mg by mouth every 6 (six) hours as needed for pain.   Yes [provider]  alendronate (FOSAMAX) 70 MG tablet Take 70 mg by mouth every 7 (seven) days. On sundays 10/05/16 10/05/17 Yes [provider]  doxycycline (VIBRAMYCIN) 100 MG capsule Take 1 capsule by mouth 2 (two) times daily. 12/26/16  Yes [provider]  enoxaparin (LOVENOX) 40 MG/0.4ML injection Inject 40 mg into the skin daily. 12/14/16  Yes [provider]  letrozole (FEMARA) 2.5 MG tablet Take 1 tablet by mouth daily. 10/05/16 10/05/17 Yes [provider]  levothyroxine (SYNTHROID, LEVOTHROID) 112 MCG tablet Take 1 tablet by mouth daily. 12/14/16 01/13/17 Yes [provider]  sodium chloride 1 g tablet Take 1 g by mouth 3 (three) times daily. 12/15/16 01/14/17 Yes [provider]  spironolactone (ALDACTONE) 50 MG tablet Take 50 mg by mouth daily.   Yes [provider]  traMADol (ULTRAM) 50 MG tablet Take 1-2 tablets (50-100 mg total) by mouth every 6 (six) hours as needed. Patient not taking: Reported on 12/28/2016 06/30/15   Coralie Keens, MD   Allergies  Allergen Reactions  . Codeine Nausea And Vomiting  . Vicodin [Hydrocodone-Acetaminophen] Nausea And Vomiting    FAMILY HISTORY:  family history includes Breast cancer in her paternal aunt; Cancer in her mother and paternal aunt; Colon cancer in her maternal uncle; Diabetes type II  in her mother; Heart disease in her mother; Heart murmur in her mother; Kidney cancer in her mother. SOCIAL HISTORY:  reports that she has never smoked. She has never used smokeless tobacco. She reports that she does not drink alcohol or use drugs.  REVIEW OF SYSTEMS:     Constitutional: Negative for fever, chills, weight loss, malaise/fatigue and diaphoresis.  HENT: Negative for hearing loss, ear pain, nosebleeds, congestion, sore throat, neck pain, tinnitus and ear discharge.   Eyes: Negative for blurred vision, double vision, photophobia, pain, discharge and redness.  Respiratory: Negative for cough, hemoptysis, sputum production, shortness of breath, wheezing and stridor.   Cardiovascular: Negative for chest pain, palpitations, orthopnea, claudication, leg swelling and PND.  Gastrointestinal: Negative for heartburn, nausea, vomiting, abdominal pain, diarrhea, constipation, blood in stool and melena.  Genitourinary: Negative for dysuria, urgency, frequency, hematuria and flank pain.  Musculoskeletal: Negative for myalgias, back pain, joint pain and falls.  Skin: Negative for itching and rash.  Neurological: Negative for dizziness, tingling, tremors, sensory change, speech change, focal weakness, seizures, loss of consciousness, weakness and headaches.  Endo/Heme/Allergies: Negative for environmental allergies and polydipsia. Does not bruise/bleed easily.  SUBJECTIVE:   VITAL SIGNS: Temp:  [96.3 F (35.7 C)-97.4 F (36.3 C)] 96.3 F (35.7 C) (09/20 0102) Pulse Rate:  [61-93] 68 (09/20 0600) Resp:  [16-34] 25 (09/20 0600) BP: (80-102)/(41-68) 102/59 (09/20 0600) SpO2:  [97 %-100 %] 100 % (09/20 0600) Weight:  [109.3 kg (241 lb)] 109.3 kg (241 lb) (09/19 2316)  PHYSICAL EXAMINATION: General: cachetic female  awake follows commands verbal  Neuro:  PERRLA EOM intact no focal deficits HEENT: thin neck wasting, normocephalic , dry mucous membranes on exam Cardiovascular:  S1 and S2 no M/R/G Lungs: decreased breath sounds bilaterally at bases no wheezing and no crackles Abdomen: very distended hypoactive BS + fluid wave  Musculoskeletal:  Lower extremities erythematous to mid shin right >left with skin tear and +++edema Skin: skin tear on right lower  leg   Recent Labs Lab 01/02/2017 2049 12/28/16 0119 12/28/16 0125 12/28/16 0500  NA 128* 128* 125* 127*  K 7.4* 7.0* 6.9* 6.7*  CL 98* 100* 101 100*  CO2 14* 15*  --  14*  BUN 97* 97* 94* 95*  CREATININE 3.90* 4.03* 4.20* 3.90*  GLUCOSE 115* 92 88 130*    Recent Labs Lab 12/18/2016 2049 12/28/16 0125 12/28/16 0500  HGB 8.9* 9.5* 7.2*  HCT 26.4* 28.0* 21.3*  WBC 20.4*  --  21.4*  PLT 488*  --  451*   Ct Abdomen Pelvis Wo Contrast  Result Date: 12/17/2016 CLINICAL DATA:  Pt was dx with a pelvic mass at the beginning of August, she has surgery at the end of August, the mass was unable to be removed and she's suppose to start chemo next week at [facility] Today pt complains of urinary retention since 9am yesterday, abdomen is distended and tight, weeping cellulitis in both lower extremities. EXAM: CT ABDOMEN AND PELVIS WITHOUT CONTRAST TECHNIQUE: Multidetector CT imaging of the abdomen and pelvis was performed following the standard protocol without IV contrast. COMPARISON:  None. FINDINGS: Evaluation is difficult without IV or oral contrast. Lower chest: Mild bibasilar atelectasis. Hepatobiliary: Liver is unremarkable. Gallbladder is not seen, presumably compressed by adjacent mass and/or bowel. No intrahepatic bile duct dilatation appreciated. Pancreas: Compressed by adjacent bowel but otherwise unremarkable. Spleen: Normal in size without focal abnormality. Adrenals/Urinary Tract: Adrenal glands are unremarkable. Kidneys are unremarkable without mass, stone or hydronephrosis. No perinephric fluid.  Bladder is obscured by the pelvic mass and surrounding bowel loops, presumably displaced to the posterior pelvis and moderately distended. Stomach/Bowel: Visualized portions of the large bowel appear decompressed. Majority of the small bowel is displaced into the upper abdomen, fluid-filled and at least mildly distended suggesting partial obstruction or ileus. Vascular/Lymphatic: No significant vascular  findings are present. No enlarged abdominal or pelvic lymph nodes. Reproductive: Obscured by the pelvic mass. Other: Massive complex cystic and solid mass occupying the majority of the pelvis and lower abdomen, with a probable cystic component extending into the left upper quadrant, overall measuring at least 26 cm craniocaudal dimension, 29 cm transverse dimension and 24 cm AP dimension. As above, the mass appears to be displacing the moderately distended bladder into the posterior pelvis. Musculoskeletal: Degenerative change throughout the scoliotic thoracolumbar spine, mild to moderate in degree. No acute or suspicious osseous finding. Ill-defined fluid/edema within the subcutaneous soft tissues of the lower abdomen and pelvis indicating anasarca. IMPRESSION: 1. Massive complex cystic and solid mass occupying the majority of the pelvis and lower abdomen, probable cystic component extending into the left upper quadrant, overall measuring at least 26 cm craniocaudal dimension, 29 cm transverse dimension and 24 cm AP dimension. 2. Mass appears to be displacing the moderately distended bladder into the posterior inferior pelvis. 3. Moderately distended fluid-filled loops of small bowel displaced into the upper abdomen, indicating partial small bowel obstruction or ileus. Large bowel is relatively decompressed, some portions of the bowel are difficult to see due to the intra-abdominal mass. 4. Anasarca. Electronically Signed   By: Franki Cabot M.D.   On: 12/11/2016 23:04   Dg Chest Port 1 View  Result Date: 12/29/2016 CLINICAL DATA:  Leukocytosis.  Shortness of breath. EXAM: PORTABLE CHEST 1 VIEW COMPARISON:  06/30/2015 FINDINGS: Right central venous catheter with tip over the low SVC region. No pneumothorax. Shallow inspiration. Mild cardiac enlargement. No vascular congestion or edema. No consolidation or airspace disease. Degenerative changes in the spine and shoulders. Mild thoracolumbar scoliosis convex  towards the right. IMPRESSION: Cardiac enlargement. Shallow inspiration. No evidence of active pulmonary disease. Electronically Signed   By: Lucienne Capers M.D.   On: 01/05/2017 22:56    ASSESSMENT / PLAN: 71 yr old female with Large pelvic mass presenting with oliguria and  hyperkalemia   Active Issues: 1. Acute Renal Failure: Hyperkalemia -> emergent HD 2. Partial Small bowel obstruction 3. 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 Hypotension - Abdominal compartment syndrome?  NEURO: Hyponatremia Pt is alert and oriented answers questions not symptomatic No focal deficits  neuirochecks Q 4  Chronic Back pain due to scoliosis- not currently in pain Continue to reassess Morphine prn on board  CARDIAC: Hypotensive Abdominal Compartment Syndrome vs Sepsis Sepsis Sources of infection: SBP vs GU and significant LE cellulitis noted Send blood cx x 2 Started on empiric antibiotics with Vancomycin and Zosyn IV Lactic acid noted 3.3  Abdominal compartment syndrome due to volume of ascites and size of mass and location - monitor bladder pressures  - decompression required if >20  Surgical consult placed Last ECHO in March 2017 EF 55-60% Repeat ECHO needed.  Needs A- line for hemodynamic monitoring MAP goal >65   PULMONARY: on supplemental O2- goal Sat >92% Dyspneic Mild bibasilar atelectasis Decreased lung volumes due to abdominal pathology decreasing lung volume CXR shows no evidence of pleural effusion  ID: Suspected sources: SBP vs GU and significant LE cellulitis noted Sent blood cultures  Started on empiric antibiotics with Vancomycin and  Zosyn vanc random level sent  Trend lactic, WBC and fever curve  Endocrine: Cortisol level BG checks with ISS Per ICU Glycemic protocol H/o Hypothyroidism - check TSH- continues on synthroid  GI: NPO for now Start on GI prophylaxis  Prn Zofran for nausea Significant Ascites most likely secondary to malignancy Has had large  volume paracentesis on 9/14 Repeat paracentesis will most likely require IR - if large volume may require albumin. ? SBP - empiric coverage with abx  Heme: Normocytic anemia with anisocytosis no obvious source of bleeding on exam FOBT May also be Anemia of chronic disease Send Iron TIBC ferritin If Hgb<7 transfuse PRBCs  Thrombocytosis- most likely reactive in setting of malignancy VTE PPx   RENAL  Acute Renal Failure: ACS vs ATN vs postobstructive  Bladder is moderately distended on CT  Place indwelling foley catheter  Transduce bladder pressures Strict Is and Os UA sent  FeNA Renal on board appreciate recs Challenge with HD planned Trialysis to be placed Baseline Cr on 12/21/16 was 0.9 .. Lab Results  Component Value Date   CREATININE 3.90 (H) 12/28/2016   CREATININE 4.20 (H) 12/28/2016   CREATININE 4.03 (H) 12/28/2016  Hyperkalemia Pt received protocol which transiently brought K+ from 7.0 to 6.7 EKG reviewed low voltage with slight peaking of T waves in anterolateral leads  Hypovolemic Hyponatremia Pt is alert and oriented answers questions not symptomatic On IVF pt has received 1.5 L  For HD should improve with volume correction  .[name] Anion Gap Acidosis elevated LA 3.22->3,3 Hypoperfusion Acute renal failure     Code Status Orders        Start     Ordered   12/28/16 0537  Full code  Continuous     12/28/16 0537    Code Status History    Date Active Date Inactive Code Status Order ID Comments User Context   12/28/2016  1:22 AM 12/28/2016  5:37 AM Full Code [id number]  Ivor Costa, MD ED     DISPOSITION: ICU Transfer to [facility] CODE STATUS: Full NEXT OF KIN: eldest daughter ( 4 more children) CRITICAL CARE TIME: 55 mins  I have discussed the plan of care with the medical team (including consultants, NP/PA, RN) and patient/patient's family members.  Signed Dr Seward [facility] [place] 12/28/2016, 6:18 AM

## 2016-12-28 NOTE — Progress Notes (Signed)
Dr Titus Mould notifed of patient ABG resluts.

## 2016-12-29 ENCOUNTER — Inpatient Hospital Stay (HOSPITAL_COMMUNITY): Payer: Medicare Other

## 2016-12-29 DIAGNOSIS — N179 Acute kidney failure, unspecified: Principal | ICD-10-CM

## 2016-12-29 LAB — CBC
HCT: 23.8 % — ABNORMAL LOW (ref 36.0–46.0)
HEMATOCRIT: 26.8 % — AB (ref 36.0–46.0)
Hemoglobin: 7.9 g/dL — ABNORMAL LOW (ref 12.0–15.0)
Hemoglobin: 8.6 g/dL — ABNORMAL LOW (ref 12.0–15.0)
MCH: 28.6 pg (ref 26.0–34.0)
MCH: 27.8 pg (ref 26.0–34.0)
MCHC: 33.2 g/dL (ref 30.0–36.0)
MCHC: 32.1 g/dL (ref 30.0–36.0)
MCV: 86.2 fL (ref 78.0–100.0)
MCV: 86.7 fL (ref 78.0–100.0)
PLATELETS: 322 10*3/uL (ref 150–400)
Platelets: 338 10*3/uL (ref 150–400)
RBC: 2.76 MIL/uL — AB (ref 3.87–5.11)
RBC: 3.09 MIL/uL — AB (ref 3.87–5.11)
RDW: 18 % — ABNORMAL HIGH (ref 11.5–15.5)
RDW: 18.4 % — ABNORMAL HIGH (ref 11.5–15.5)
WBC: 18.5 10*3/uL — AB (ref 4.0–10.5)
WBC: 19.7 10*3/uL — AB (ref 4.0–10.5)

## 2016-12-29 LAB — GLUCOSE, CAPILLARY
GLUCOSE-CAPILLARY: 77 mg/dL (ref 65–99)
GLUCOSE-CAPILLARY: 100 mg/dL — AB (ref 65–99)
GLUCOSE-CAPILLARY: 137 mg/dL — AB (ref 65–99)
GLUCOSE-CAPILLARY: 137 mg/dL — AB (ref 65–99)
GLUCOSE-CAPILLARY: 146 mg/dL — AB (ref 65–99)
Glucose-Capillary: 117 mg/dL — ABNORMAL HIGH (ref 65–99)

## 2016-12-29 LAB — CORTISOL: CORTISOL PLASMA: 42 ug/dL

## 2016-12-29 LAB — RENAL FUNCTION PANEL
Albumin: 2.7 g/dL — ABNORMAL LOW (ref 3.5–5.0)
Anion gap: 15 (ref 5–15)
BUN: 69 mg/dL — ABNORMAL HIGH (ref 6–20)
CHLORIDE: 98 mmol/L — AB (ref 101–111)
CO2: 19 mmol/L — ABNORMAL LOW (ref 22–32)
CREATININE: 3.86 mg/dL — AB (ref 0.44–1.00)
Calcium: 7.7 mg/dL — ABNORMAL LOW (ref 8.9–10.3)
GFR, EST AFRICAN AMERICAN: 13 mL/min — AB (ref >60)
GFR, EST NON AFRICAN AMERICAN: 11 mL/min — AB (ref >60)
Glucose, Bld: 146 mg/dL — ABNORMAL HIGH (ref 65–99)
Phosphorus: 7.3 mg/dL — ABNORMAL HIGH (ref 2.5–4.6)
Potassium: 5.8 mmol/L — ABNORMAL HIGH (ref 3.5–5.1)
Sodium: 132 mmol/L — ABNORMAL LOW (ref 135–145)

## 2016-12-29 LAB — BASIC METABOLIC PANEL
ANION GAP: 12 (ref 5–15)
BUN: 59 mg/dL — ABNORMAL HIGH (ref 6–20)
CHLORIDE: 100 mmol/L — AB (ref 101–111)
CO2: 20 mmol/L — ABNORMAL LOW (ref 22–32)
Calcium: 7.9 mg/dL — ABNORMAL LOW (ref 8.9–10.3)
Creatinine, Ser: 3.1 mg/dL — ABNORMAL HIGH (ref 0.44–1.00)
GFR, EST AFRICAN AMERICAN: 17 mL/min — AB (ref >60)
GFR, EST NON AFRICAN AMERICAN: 14 mL/min — AB (ref >60)
Glucose, Bld: 98 mg/dL (ref 65–99)
POTASSIUM: 5.4 mmol/L — AB (ref 3.5–5.1)
SODIUM: 132 mmol/L — AB (ref 135–145)

## 2016-12-29 LAB — PHOSPHORUS: Phosphorus: 6.4 mg/dL — ABNORMAL HIGH (ref 2.5–4.6)

## 2016-12-29 LAB — HEPATITIS B SURFACE ANTIBODY,QUALITATIVE: Hep B S Ab: NONREACTIVE

## 2016-12-29 LAB — HEPATITIS B SURFACE ANTIGEN: Hepatitis B Surface Ag: NEGATIVE

## 2016-12-29 LAB — HEPATITIS B CORE ANTIBODY, TOTAL: HEP B C TOTAL AB: NEGATIVE

## 2016-12-29 LAB — MAGNESIUM: MAGNESIUM: 2.1 mg/dL (ref 1.7–2.4)

## 2016-12-29 LAB — PROCALCITONIN: Procalcitonin: 4.13 ng/mL

## 2016-12-29 MED ORDER — ALBUMIN HUMAN 25 % IV SOLN
12.5000 g | Freq: Once | INTRAVENOUS | Status: AC
  Administered 2016-12-29: 12.5 g via INTRAVENOUS
  Filled 2016-12-29: qty 50

## 2016-12-29 MED ORDER — PIPERACILLIN-TAZOBACTAM 3.375 G IVPB
3.3750 g | Freq: Two times a day (BID) | INTRAVENOUS | Status: DC
  Administered 2016-12-29 – 2017-01-01 (×6): 3.375 g via INTRAVENOUS
  Filled 2016-12-29 (×6): qty 50

## 2016-12-29 MED ORDER — NOREPINEPHRINE BITARTRATE 1 MG/ML IV SOLN
0.0000 ug/min | INTRAVENOUS | Status: DC
  Administered 2016-12-29: 20 ug/min via INTRAVENOUS
  Filled 2016-12-29 (×2): qty 4

## 2016-12-29 MED ORDER — HEPARIN SODIUM (PORCINE) 5000 UNIT/ML IJ SOLN
5000.0000 [IU] | Freq: Three times a day (TID) | INTRAMUSCULAR | Status: DC
  Administered 2016-12-29 – 2017-01-01 (×10): 5000 [IU] via SUBCUTANEOUS
  Filled 2016-12-29 (×13): qty 1

## 2016-12-29 MED ORDER — DEXTROSE 5 % IV SOLN
0.0000 ug/min | INTRAVENOUS | Status: DC
  Administered 2016-12-29 – 2016-12-30 (×2): 15 ug/min via INTRAVENOUS
  Administered 2016-12-30: 40 ug/min via INTRAVENOUS
  Administered 2016-12-31: 14 ug/min via INTRAVENOUS
  Administered 2017-01-01: 20 ug/min via INTRAVENOUS
  Filled 2016-12-29 (×6): qty 16

## 2016-12-29 MED ORDER — WHITE PETROLATUM GEL
Status: AC
  Administered 2016-12-29: 1
  Filled 2016-12-29: qty 1

## 2016-12-29 MED ORDER — HYDROCORTISONE NA SUCCINATE PF 100 MG IJ SOLR
50.0000 mg | Freq: Four times a day (QID) | INTRAMUSCULAR | Status: DC
  Administered 2016-12-29 – 2016-12-30 (×3): 50 mg via INTRAVENOUS
  Filled 2016-12-29 (×2): qty 1
  Filled 2016-12-29: qty 2
  Filled 2016-12-29: qty 1
  Filled 2016-12-29: qty 2
  Filled 2016-12-29 (×2): qty 1

## 2016-12-29 MED ORDER — VANCOMYCIN HCL IN DEXTROSE 1-5 GM/200ML-% IV SOLN: 1000.0000 mg | INTRAVENOUS | Status: DC

## 2016-12-29 MED ORDER — SODIUM CHLORIDE 0.9 % IV BOLUS (SEPSIS)
500.0000 mL | Freq: Once | INTRAVENOUS | Status: AC
  Administered 2016-12-29: 500 mL via INTRAVENOUS

## 2016-12-29 NOTE — Progress Notes (Signed)
Patient trialysis catheter does not have the dialysis catheter caps or labeled that heparin in instilled.

## 2016-12-29 NOTE — Progress Notes (Signed)
..   Name: Ana Woodward MRN: [id number] DOB: [birth year]    ADMISSION DATE:  12/14/2016 CONSULTATION DATE:  12/28/16  REFERRING MD :  Gabriel Cirri  CHIEF COMPLAINT:  No Urine output >24 hrs & SOB   BRIEF PATIENT DESCRIPTION: 71 yr old female with a PMHx significant for breast cancer, 25x17x19cm pelvic mass on CT s/p biopsy presents hypotensive and SOB with oliguria, hyperkalemia and acidosis.   SIGNIFICANT EVENTS  Rt porta cath in place>> 9/19 lt I j 3 port hd cath>>  STUDIES:  CT Abdomen/Pelvis as noted  SUBJECTIVE:  Emergent HD, K down, acidosis better, rise in pressors [facility] contacted, NO icu beds  VITAL SIGNS: Temp:  [94.8 F (34.9 C)-98.4 F (36.9 C)] 98.3 F (36.8 C) (09/21 0825) Pulse Rate:  [65-89] 81 (09/21 0645) Resp:  [15-34] 20 (09/21 0645) BP: (68-96)/(45-76) 92/56 (09/21 0645) SpO2:  [99 %-100 %] 100 % (09/21 0645) Weight:  [108.7 kg (239 lb 10.2 oz)] 108.7 kg (239 lb 10.2 oz) (09/21 0305)  PHYSICAL EXAMINATION: General: cachectic, no distress Neuro: alert, Oriented, not talk much, nonfocal HEENT: jvd down PULM: CTA reduced CV:  s1 s2 rrr distant GI: firm, distended severely with fluid wave, low BS, no rebound, no guarding, tender lower pelvis anterior Extremities: edema 4-5 plus     Recent Labs Lab 12/28/16 1604 12/28/16 1732 12/29/16 0418  NA 128* 132* 132*  K 6.9* 3.9 5.4*  CL 100* 100* 100*  CO2 16* 24 20*  BUN 96* 41* 59*  CREATININE 3.96* 2.04* 3.10*  GLUCOSE 108* 91 98    Recent Labs Lab 12/28/16 0500 12/28/16 1606 12/29/16 0418  HGB 7.2* 7.7* 7.9*  HCT 21.3* 23.7* 23.8*  WBC 21.4* 16.6* 18.5*  PLT 451* 404* 338   Ct Abdomen Pelvis Wo Contrast  Result Date: 12/26/2016 CLINICAL DATA:  Pt was dx with a pelvic mass at the beginning of August, she has surgery at the end of August, the mass was unable to be removed and she's suppose to start chemo next week at [facility] Today pt complains of urinary retention since 9am yesterday, abdomen is  distended and tight, weeping cellulitis in both lower extremities. EXAM: CT ABDOMEN AND PELVIS WITHOUT CONTRAST TECHNIQUE: Multidetector CT imaging of the abdomen and pelvis was performed following the standard protocol without IV contrast. COMPARISON:  None. FINDINGS: Evaluation is difficult without IV or oral contrast. Lower chest: Mild bibasilar atelectasis. Hepatobiliary: Liver is unremarkable. Gallbladder is not seen, presumably compressed by adjacent mass and/or bowel. No intrahepatic bile duct dilatation appreciated. Pancreas: Compressed by adjacent bowel but otherwise unremarkable. Spleen: Normal in size without focal abnormality. Adrenals/Urinary Tract: Adrenal glands are unremarkable. Kidneys are unremarkable without mass, stone or hydronephrosis. No perinephric fluid. Bladder is obscured by the pelvic mass and surrounding bowel loops, presumably displaced to the posterior pelvis and moderately distended. Stomach/Bowel: Visualized portions of the large bowel appear decompressed. Majority of the small bowel is displaced into the upper abdomen, fluid-filled and at least mildly distended suggesting partial obstruction or ileus. Vascular/Lymphatic: No significant vascular findings are present. No enlarged abdominal or pelvic lymph nodes. Reproductive: Obscured by the pelvic mass. Other: Massive complex cystic and solid mass occupying the majority of the pelvis and lower abdomen, with a probable cystic component extending into the left upper quadrant, overall measuring at least 26 cm craniocaudal dimension, 29 cm transverse dimension and 24 cm AP dimension. As above, the mass appears to be displacing the moderately distended bladder into the posterior pelvis. Musculoskeletal:  Degenerative change throughout the scoliotic thoracolumbar spine, mild to moderate in degree. No acute or suspicious osseous finding. Ill-defined fluid/edema within the subcutaneous soft tissues of the lower abdomen and pelvis indicating  anasarca. IMPRESSION: 1. Massive complex cystic and solid mass occupying the majority of the pelvis and lower abdomen, probable cystic component extending into the left upper quadrant, overall measuring at least 26 cm craniocaudal dimension, 29 cm transverse dimension and 24 cm AP dimension. 2. Mass appears to be displacing the moderately distended bladder into the posterior inferior pelvis. 3. Moderately distended fluid-filled loops of small bowel displaced into the upper abdomen, indicating partial small bowel obstruction or ileus. Large bowel is relatively decompressed, some portions of the bowel are difficult to see due to the intra-abdominal mass. 4. Anasarca. Electronically Signed   By: Franki Cabot M.D.   On: 12/26/2016 23:04   Dg Chest Port 1 View  Result Date: 12/29/2016 CLINICAL DATA:  Respiratory failure. EXAM: PORTABLE CHEST 1 VIEW COMPARISON:  12/28/2016. FINDINGS: PowerPort catheter left IJ line in unchanged position. Left IJ line again noted with tip in the right atrium. Again consider retraction of approximately 4 6 cm. Cardiomegaly with normal pulmonary vascularity. Basilar atelectasis. Tiny left pleural effusion. IMPRESSION: 1. PowerPort catheter left IJ line in unchanged position. Left IJ line tip again noted in the right atrium. Again consider retraction by approximately 4-6 cm. 2. Basilar atelectasis. Tiny left pleural effusion. Chest is unchanged prior exam . Electronically Signed   By: Marcello Moores  Register   On: 12/29/2016 06:37   Dg Chest Port 1 View  Result Date: 12/28/2016 CLINICAL DATA:  Central line placement. EXAM: PORTABLE CHEST 1 VIEW COMPARISON:  Chest x-ray from yesterday. FINDINGS: Interval placement of a left internal jugular central venous catheter with the tip in the lower right atrium. Unchanged right chest wall port catheter with the tip projecting over the cavoatrial junction. Mild cardiomegaly, unchanged. Low lung volumes with bibasilar atelectasis. No focal  consolidation, pleural effusion, or pneumothorax. IMPRESSION: 1. Interval placement of a left internal jugular central venous catheter with the tip in the lower right atrium. Consider retraction by 4-6 cm. No pneumothorax. 2. Low lung volumes with bibasilar atelectasis. Electronically Signed   By: Titus Dubin M.D.   On: 12/28/2016 11:48   Dg Chest Port 1 View  Result Date: 12/11/2016 CLINICAL DATA:  Leukocytosis.  Shortness of breath. EXAM: PORTABLE CHEST 1 VIEW COMPARISON:  06/30/2015 FINDINGS: Right central venous catheter with tip over the low SVC region. No pneumothorax. Shallow inspiration. Mild cardiac enlargement. No vascular congestion or edema. No consolidation or airspace disease. Degenerative changes in the spine and shoulders. Mild thoracolumbar scoliosis convex towards the right. IMPRESSION: Cardiac enlargement. Shallow inspiration. No evidence of active pulmonary disease. Electronically Signed   By: Lucienne Capers M.D.   On: 12/11/2016 22:56    ASSESSMENT / PLAN: 71 yr old female with Large pelvic mass presenting with oliguria and  hyperkalemia   Active Issues: 1. Acute Renal Failure: Hyperkalemia -> emergent HD 2. Partial Small bowel obstruction 3. 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 Hypotension  NEURO: Hyponatremia -neurochecks  Chronic Back pain due to scoliosis- not currently in pain Continue to reassess Morphine dc as in shock and avoid resp suppression, no sig pain reported  CARDIAC: Hypotensive Hypovolemia Rule out Sepsis -neo is max at 200 -consider bolus assessment response, preload likely suppressed from mass -obtain cortisol, then add empiric stress roids -may need to transition to levophed -may need addition vasopressin -get cvp -repeat  echo to rule out effusion - reviewed = neg -consider non invasive monitor, assess fluid responsiveness   PULMONARY: on supplemental O2- goal Sat >92% Dyspneic improved -may need there para -ABG resolved, no  repeat needed  ID: Suspected sources: SBP vs GU and significant LE cellulitis noted Follow cultures -keep Vosyn for now  Endocrine: CBG (last 3)   Recent Labs  12/28/16 2327 12/29/16 0305 12/29/16 0822  GLUCAP 85 77 100*     Cortisol level sent, adding empiric roids BG checks with ISS  GI: NPO, low threshold add NGT to suction GI prophylaxis  Prn Zofran for nausea May need repeat paracentesis if symptoms worsen, reluctant to tap with shock  Heme: Normocytic anemia with anisocytosis  Recent Labs  12/28/16 1606 12/29/16 0418  HGB 7.7* 7.9*    If Hgb<7 transfuse PRBCs Cbc in am   Thrombocytosis- most likely reactive in setting of malignancy VTE PPx  No lov with arf With cancer , consider at least sub q heparin  RENAL Lab Results  Component Value Date   CREATININE 3.10 (H) 12/29/2016   CREATININE 2.04 (H) 12/28/2016   CREATININE 3.96 (H) 12/28/2016    Recent Labs Lab 12/28/16 1604 12/28/16 1732 12/29/16 0418  K 6.9* 3.9 5.4*     Acute Renal Failure: ACS vs ATN vs postobstructive hyperk improved  Bolus HD repeat x 1 per renal Consider albumin challenge  Ccm time 35 min   I updated daughter and pt in full  [facility] - no icu bed, there goal is acceptance when out of icu, concerned this is not gonna happen quickly  [employer]. Titus Mould, MD, [place] Pgr: [facility] 12/29/2016 8:28 AM

## 2016-12-29 NOTE — Progress Notes (Signed)
Admit: 12/28/2016 LOS: 1  40F AKI (nl BL GFR) and large pelvic/abd sarcoma; hypotension  Subjective:  HD yesterday, req NE gtt K 5.4 now Family hopeful for transfer to [facility] for [facility] care Minimal UOP CT Abd was w/o HN, foley in place Gen Surg eval for compartment syndrome; transduced pressure was 19; no intervention CCM given further IVFs    09/20 0701 - 09/21 0700 In: 2827.8 [I.V.:2727.8; IV Piggyback:100] Out: -9 [Urine:80]  Filed Weights   12/24/2016 2316 12/29/16 0305  Weight: 109.3 kg (241 lb) 108.7 kg (239 lb 10.2 oz)    Scheduled Meds: . heparin subcutaneous  5,000 Units Subcutaneous Q8H  . hydrocortisone sod succinate (SOLU-CORTEF) inj  50 mg Intravenous Q6H  . levothyroxine  56 mcg Intravenous Daily   Continuous Infusions: . sodium chloride 250 mL (12/28/16 1053)  . sodium chloride    . sodium chloride    . albumin human    . albumin human    . norepinephrine (LEVOPHED) Adult infusion    . piperacillin-tazobactam (ZOSYN)  IV 2.25 g (12/29/16 0530)  . sodium chloride    . [START ON 12/30/2016] vancomycin     PRN Meds:.sodium chloride, sodium chloride, sodium chloride, acetaminophen, alteplase, heparin, lidocaine (PF), lidocaine-prilocaine, ondansetron **OR** ondansetron (ZOFRAN) IV, pantoprazole (PROTONIX) IV, pentafluoroprop-tetrafluoroeth  Current Labs: reviewed    Physical Exam:  Blood pressure (!) 92/56, pulse 81, temperature 98.3 F (36.8 C), temperature source Oral, resp. rate 20, height 5\' 8"  (1.727 m), weight 108.7 kg (239 lb 10.2 oz), SpO2 100 %. Cachectic, awake, smiling Temporal wasting RRR Diminished BS throughout Firm, protuberant abdomen 3-4+ LEE Nonfocal  A 1. AKI likely hypovolemia, ? Obstruction but no HN and foley present on Abd CT; seen by CCS for abd compartment syndrome, no intervention needed 2. Hypotension on NE 3. Large aggressive abd malignancy, followed by [facility], CTX was offered there; plan for ? Transfer 4. pSBO 5. Ascites,  malignant 6. Anemia, not on ESA; per CCM transfuse as needed  P 1. HD again today: 3h, Qb 400, TDC, No heparin, 2K bath; albumin as needed 2. Agree with IVFs   Pearson Grippe MD 12/29/2016, 8:46 AM   Recent Labs Lab 12/28/16 1604 12/28/16 1732 12/29/16 0418  NA 128* 132* 132*  K 6.9* 3.9 5.4*  CL 100* 100* 100*  CO2 16* 24 20*  GLUCOSE 108* 91 98  BUN 96* 41* 59*  CREATININE 3.96* 2.04* 3.10*  CALCIUM 8.0* 8.0* 7.9*  PHOS 7.9* 3.8 6.4*    Recent Labs Lab 01/03/2017 2049  12/28/16 0500 12/28/16 1606 12/29/16 0418  WBC 20.4*  --  21.4* 16.6* 18.5*  NEUTROABS 18.3*  --   --   --   --   HGB 8.9*  < > 7.2* 7.7* 7.9*  HCT 26.4*  < > 21.3* 23.7* 23.8*  MCV 85.4  --  86.9 86.8 86.2  PLT 488*  --  451* 404* 338  < > = values in this interval not displayed.

## 2016-12-30 ENCOUNTER — Inpatient Hospital Stay (HOSPITAL_COMMUNITY): Payer: Medicare Other

## 2016-12-30 LAB — GLUCOSE, CAPILLARY
GLUCOSE-CAPILLARY: 135 mg/dL — AB (ref 65–99)
GLUCOSE-CAPILLARY: 143 mg/dL — AB (ref 65–99)
Glucose-Capillary: 146 mg/dL — ABNORMAL HIGH (ref 65–99)
Glucose-Capillary: 149 mg/dL — ABNORMAL HIGH (ref 65–99)
Glucose-Capillary: 136 mg/dL — ABNORMAL HIGH (ref 65–99)

## 2016-12-30 LAB — CBC
HCT: 27.4 % — ABNORMAL LOW (ref 36.0–46.0)
HEMOGLOBIN: 8.8 g/dL — AB (ref 12.0–15.0)
MCH: 27.8 pg (ref 26.0–34.0)
MCHC: 32.1 g/dL (ref 30.0–36.0)
MCV: 86.7 fL (ref 78.0–100.0)
PLATELETS: 279 10*3/uL (ref 150–400)
RBC: 3.16 MIL/uL — ABNORMAL LOW (ref 3.87–5.11)
RDW: 18.4 % — AB (ref 11.5–15.5)
WBC: 20.6 10*3/uL — ABNORMAL HIGH (ref 4.0–10.5)

## 2016-12-30 LAB — BASIC METABOLIC PANEL
Anion gap: 15 (ref 5–15)
BUN: 44 mg/dL — ABNORMAL HIGH (ref 6–20)
CO2: 20 mmol/L — ABNORMAL LOW (ref 22–32)
CREATININE: 2.9 mg/dL — AB (ref 0.44–1.00)
Calcium: 7.6 mg/dL — ABNORMAL LOW (ref 8.9–10.3)
Chloride: 98 mmol/L — ABNORMAL LOW (ref 101–111)
GFR calc non Af Amer: 15 mL/min — ABNORMAL LOW (ref >60)
GFR, EST AFRICAN AMERICAN: 18 mL/min — AB (ref >60)
Glucose, Bld: 153 mg/dL — ABNORMAL HIGH (ref 65–99)
Potassium: 4.7 mmol/L (ref 3.5–5.1)
SODIUM: 133 mmol/L — AB (ref 135–145)

## 2016-12-30 LAB — PROCALCITONIN: PROCALCITONIN: 7.78 ng/mL

## 2016-12-30 MED ORDER — VANCOMYCIN HCL IN DEXTROSE 1-5 GM/200ML-% IV SOLN
1000.0000 mg | Freq: Once | INTRAVENOUS | Status: AC
  Administered 2016-12-30: 1000 mg via INTRAVENOUS
  Filled 2016-12-30: qty 200

## 2016-12-30 MED ORDER — ALBUMIN HUMAN 25 % IV SOLN
12.5000 g | Freq: Once | INTRAVENOUS | Status: AC
  Administered 2016-12-30: 12.5 g via INTRAVENOUS
  Filled 2016-12-30: qty 50

## 2016-12-30 MED ORDER — VASOPRESSIN 20 UNIT/ML IV SOLN
0.0300 [IU]/min | INTRAVENOUS | Status: DC
  Administered 2016-12-30 – 2017-01-01 (×4): 0.03 [IU]/min via INTRAVENOUS
  Filled 2016-12-30 (×4): qty 2

## 2016-12-30 NOTE — Progress Notes (Signed)
HD tx initiated via HD cath w/o problem, pull/push/flush equally w/o problem, VSS w/ low bp on levophed drip, will cont to monitor while on HD tx

## 2016-12-30 NOTE — Progress Notes (Signed)
..   Name: Ana Woodward MRN: [id number] DOB: Feb 17, 1946    ADMISSION DATE:  12/15/2016 CONSULTATION DATE:  12/28/16  REFERRING MD :  Gabriel Cirri  CHIEF COMPLAINT:  No Urine output >24 hrs & SOB   BRIEF PATIENT DESCRIPTION: 71 yr old female with a PMHx significant for breast cancer, 25x17x19cm pelvic mass on CT s/p biopsy presents hypotensive and SOB with oliguria, hyperkalemia and acidosis.   SIGNIFICANT EVENTS  Rt porta cath in place>> 9/19 lt I j 3 port hd cath>>  STUDIES:  CT Abdomen/Pelvis as noted  SUBJECTIVE:  Vomiting HD done yesterday Levo to 30 Normal resp status  VITAL SIGNS: Temp:  [97.1 F (36.2 C)-98.4 F (36.9 C)] 97.1 F (36.2 C) (09/22 0315) Pulse Rate:  [67-107] 98 (09/22 0615) Resp:  [18-38] 20 (09/22 0615) BP: (66-131)/(45-89) 95/65 (09/22 0615) SpO2:  [94 %-100 %] 97 % (09/22 0615) Arterial Line BP: (57-105)/(40-87) 77/56 (09/22 0600) Weight:  [110.5 kg (243 lb 9.7 oz)] 110.5 kg (243 lb 9.7 oz) (09/22 0315)  PHYSICAL EXAMINATION: General: no distress Neuro: awake, conversing, oriented HEENT: jvd low PULM: slight coarse CV: s1 s2 rrr no  GI: soft, BS low severe distention, slight tender lower, no r/g Extremities:  Edema 3 plus, wound left anterior erythema , unchanghed      Recent Labs Lab 12/28/16 1732 12/29/16 0418 12/29/16 2135  NA 132* 132* 132*  K 3.9 5.4* 5.8*  CL 100* 100* 98*  CO2 24 20* 19*  BUN 41* 59* 69*  CREATININE 2.04* 3.10* 3.86*  GLUCOSE 91 98 146*    Recent Labs Lab 12/29/16 0418 12/29/16 2135 12/30/16 0643  HGB 7.9* 8.6* 8.8*  HCT 23.8* 26.8* 27.4*  WBC 18.5* 19.7* 20.6*  PLT 338 322 279   Dg Chest Port 1 View  Result Date: 12/30/2016 CLINICAL DATA:  Acute renal failure EXAM: PORTABLE CHEST 1 VIEW COMPARISON:  Chest x-rays dated 12/29/2016 and 12/26/2016. FINDINGS: Study is hypoinspiratory with crowding of the perihilar bronchovascular markings. Given the low lung volumes, lungs appear clear. Probable small  left pleural effusion. No pneumothorax seen. Heart size and mediastinal contours are stable. Right chest wall Port-A-Cath is stable in position with tip at the level of the mid/lower SVC. IMPRESSION: Low lung volumes. No evidence of pneumonia or pulmonary edema. Probable small left pleural effusion. Electronically Signed   By: Franki Cabot M.D.   On: 12/30/2016 07:14   Dg Chest Port 1 View  Result Date: 12/29/2016 CLINICAL DATA:  Respiratory failure. EXAM: PORTABLE CHEST 1 VIEW COMPARISON:  12/28/2016. FINDINGS: PowerPort catheter left IJ line in unchanged position. Left IJ line again noted with tip in the right atrium. Again consider retraction of approximately 4 6 cm. Cardiomegaly with normal pulmonary vascularity. Basilar atelectasis. Tiny left pleural effusion. IMPRESSION: 1. PowerPort catheter left IJ line in unchanged position. Left IJ line tip again noted in the right atrium. Again consider retraction by approximately 4-6 cm. 2. Basilar atelectasis. Tiny left pleural effusion. Chest is unchanged prior exam . Electronically Signed   By: Marcello Moores  Register   On: 12/29/2016 06:37   Dg Chest Port 1 View  Result Date: 12/28/2016 CLINICAL DATA:  Central line placement. EXAM: PORTABLE CHEST 1 VIEW COMPARISON:  Chest x-ray from yesterday. FINDINGS: Interval placement of a left internal jugular central venous catheter with the tip in the lower right atrium. Unchanged right chest wall port catheter with the tip projecting over the cavoatrial junction. Mild cardiomegaly, unchanged. Low lung volumes with bibasilar atelectasis.  No focal consolidation, pleural effusion, or pneumothorax. IMPRESSION: 1. Interval placement of a left internal jugular central venous catheter with the tip in the lower right atrium. Consider retraction by 4-6 cm. No pneumothorax. 2. Low lung volumes with bibasilar atelectasis. Electronically Signed   By: Titus Dubin M.D.   On: 12/28/2016 11:48    ASSESSMENT / PLAN: 71 yr old female  with Large pelvic mass presenting with oliguria and  hyperkalemia   Active Issues: 1. Acute Renal Failure: Hyperkalemia -> emergent HD 2. Partial Small bowel obstruction 3. 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 Hypotension  NEURO: Hyponatremia -await AM chem  CARDIAC: Hypotensive / shock Hypovolemia on admission -cortisol adequate, dc steroids -using NON invasive monitor, assess SVV, will drive fluids needed -await cvp on line -levophed to map 60, sys 90 -add vaso  PULMONARY: on supplemental O2- goal Sat >92% Dyspneic improved -consider cvvhd to even balance -may need para by IR  ID: Suspected sources: SBP vs GU and significant LE cellulitis noted Follow cultures -dc vanc -maintain zosyn   Endocrine: CBG (last 3)   Recent Labs  12/29/16 1918 12/29/16 2318 12/30/16 0343  GLUCAP 137* 146* 135*     Cortisol level wnl, dc roids BG checks with ISS  GI: Diet tolerated overall Did have vomiting x 1 after cranberry, will avoid this, if worsening wil place NGT GI prophylaxis  Prn Zofran for nausea May need repeat paracentesis if symptoms worsen, reluctant to tap with shock state  Heme: Normocytic anemia with anisocytosis  Recent Labs  12/29/16 2135 12/30/16 0643  HGB 8.6* 8.8*    Cbc in am  Sub q hep No lovenox with renal failure  Thrombocytosis- most likely reactive in setting of malignancy - wnl VTE PPx  No lov with arf With cancer , consider at least sub q heparin  RENAL Lab Results  Component Value Date   CREATININE 3.86 (H) 12/29/2016   CREATININE 3.10 (H) 12/29/2016   CREATININE 2.04 (H) 12/28/2016    Recent Labs Lab 12/28/16 1732 12/29/16 0418 12/29/16 2135  K 3.9 5.4* 5.8*     Acute Renal Failure: ACS vs ATN vs postobstructive hyperk improved cvvhd consideration Consider albumin challenge x1 further and assess SVV  Ccm time 35 min   Lavon Paganini. Titus Mould, MD, [place] Pgr: [facility] 12/30/2016  7:23 AM

## 2016-12-30 NOTE — Progress Notes (Signed)
Admit: 12/29/2016 LOS: 2  74F AKI (nl BL GFR) and large pelvic/abd sarcoma; hypotension  Subjective: HD overnight, no heparin, had clotting, req inc NE gtt for BP support NoUF No UOP   09/21 0701 - 09/22 0700 In: 1778.9 [I.V.:1728.9; IV Piggyback:50] Out: 40 [Urine:40]  Filed Weights   12/20/2016 2316 12/29/16 0305 12/30/16 0315  Weight: 109.3 kg (241 lb) 108.7 kg (239 lb 10.2 oz) 110.5 kg (243 lb 9.7 oz)    Scheduled Meds: . heparin subcutaneous  5,000 Units Subcutaneous Q8H  . levothyroxine  56 mcg Intravenous Daily   Continuous Infusions: . sodium chloride 250 mL (12/28/16 1053)  . sodium chloride    . sodium chloride    . albumin human    . albumin human    . norepinephrine (LEVOPHED) Adult infusion 30 mcg/min (12/30/16 0420)  . piperacillin-tazobactam (ZOSYN)  IV 3.375 g (12/30/16 9937)  . vasopressin (PITRESSIN) infusion - *FOR SHOCK*     PRN Meds:.sodium chloride, sodium chloride, sodium chloride, acetaminophen, alteplase, heparin, lidocaine (PF), lidocaine-prilocaine, ondansetron **OR** ondansetron (ZOFRAN) IV, pantoprazole (PROTONIX) IV, pentafluoroprop-tetrafluoroeth  Current Labs: reviewed    Physical Exam:  Blood pressure 95/65, pulse 98, temperature (!) 97.1 F (36.2 C), temperature source Axillary, resp. rate 20, height 5\' 8"  (1.727 m), weight 110.5 kg (243 lb 9.7 oz), SpO2 97 %. Cachectic, awake, smiling L temp HD cath in IJ Temporal wasting RRR Diminished BS throughout Firm, protuberant abdomen 3-4+ LEE Nonfocal  A 1. AKI likely hypovolemia, ? Obstruction but no HN and foley present on Abd CT; seen by CCS for abd compartment syndrome, no intervention needed 2. Hypotension on NE 3. Large aggressive abd malignancy, followed by [facility], CTX was offered there; plan for ? Transfer 4. pSBO 5. Ascites, malignant 6. Anemia, not on ESA; per CCM transfuse as needed  P 1. Pt poorly tolerating iHD 2/2 hypotension despite pressor support, if further RRT needed  would need CRRT; none needed today at this time 2. Will follow closely   Pearson Grippe MD 12/30/2016, 7:44 AM   Recent Labs Lab 12/28/16 1732 12/29/16 0418 12/29/16 2135 12/30/16 0643  NA 132* 132* 132* 133*  K 3.9 5.4* 5.8* 4.7  CL 100* 100* 98* 98*  CO2 24 20* 19* 20*  GLUCOSE 91 98 146* 153*  BUN 41* 59* 69* 44*  CREATININE 2.04* 3.10* 3.86* 2.90*  CALCIUM 8.0* 7.9* 7.7* 7.6*  PHOS 3.8 6.4* 7.3*  --     Recent Labs Lab 12/09/2016 2049  12/29/16 0418 12/29/16 2135 12/30/16 0643  WBC 20.4*  < > 18.5* 19.7* 20.6*  NEUTROABS 18.3*  --   --   --   --   HGB 8.9*  < > 7.9* 8.6* 8.8*  HCT 26.4*  < > 23.8* 26.8* 27.4*  MCV 85.4  < > 86.2 86.7 86.7  PLT 488*  < > 338 322 279  < > = values in this interval not displayed.

## 2016-12-30 NOTE — Progress Notes (Signed)
Unable to get reading off CVP.

## 2016-12-30 NOTE — Progress Notes (Signed)
HD tx completed @ 1712 w/ bp issues throughout tx, pt was asymptomatic and pt was on levophed drip that was titrated up during the tx by the primary nurse, Uf goal met/kept even per MD order, VSS on levophed drip, also pt needs some type of heparinization while on HD tx as if she hadn't have finished when she did she would have clotted her system, it was hard to get her rinsed back but all blood was able to be returned, report given to Deboraha Sprang, RN

## 2016-12-31 ENCOUNTER — Inpatient Hospital Stay (HOSPITAL_COMMUNITY): Payer: Medicare Other

## 2016-12-31 LAB — CBC WITH DIFFERENTIAL/PLATELET
Basophils Absolute: 0 10*3/uL (ref 0.0–0.1)
Basophils Relative: 0 %
Eosinophils Absolute: 0.1 10*3/uL (ref 0.0–0.7)
Eosinophils Relative: 0 %
HEMATOCRIT: 22.8 % — AB (ref 36.0–46.0)
HEMOGLOBIN: 7.4 g/dL — AB (ref 12.0–15.0)
LYMPHS ABS: 0.9 10*3/uL (ref 0.7–4.0)
Lymphocytes Relative: 7 %
MCH: 28.1 pg (ref 26.0–34.0)
MCHC: 32.5 g/dL (ref 30.0–36.0)
MCV: 86.7 fL (ref 78.0–100.0)
MONOS PCT: 8 %
Monocytes Absolute: 1.1 10*3/uL — ABNORMAL HIGH (ref 0.1–1.0)
NEUTROS ABS: 11.3 10*3/uL — AB (ref 1.7–7.7)
NEUTROS PCT: 85 %
Platelets: 184 10*3/uL (ref 150–400)
RBC: 2.63 MIL/uL — ABNORMAL LOW (ref 3.87–5.11)
RDW: 18.8 % — ABNORMAL HIGH (ref 11.5–15.5)
WBC: 13.4 10*3/uL — ABNORMAL HIGH (ref 4.0–10.5)

## 2016-12-31 LAB — COMPREHENSIVE METABOLIC PANEL
ALBUMIN: 2.3 g/dL — AB (ref 3.5–5.0)
ALK PHOS: 57 U/L (ref 38–126)
ALT: 16 U/L (ref 14–54)
AST: 31 U/L (ref 15–41)
Anion gap: 14 (ref 5–15)
BUN: 60 mg/dL — AB (ref 6–20)
CALCIUM: 7 mg/dL — AB (ref 8.9–10.3)
CO2: 19 mmol/L — AB (ref 22–32)
Chloride: 98 mmol/L — ABNORMAL LOW (ref 101–111)
Creatinine, Ser: 3.82 mg/dL — ABNORMAL HIGH (ref 0.44–1.00)
GFR calc Af Amer: 13 mL/min — ABNORMAL LOW (ref >60)
GFR calc non Af Amer: 11 mL/min — ABNORMAL LOW (ref >60)
GLUCOSE: 126 mg/dL — AB (ref 65–99)
Potassium: 5.6 mmol/L — ABNORMAL HIGH (ref 3.5–5.1)
SODIUM: 131 mmol/L — AB (ref 135–145)
Total Bilirubin: 1.2 mg/dL (ref 0.3–1.2)
Total Protein: 4.8 g/dL — ABNORMAL LOW (ref 6.5–8.1)

## 2016-12-31 LAB — GLUCOSE, CAPILLARY
GLUCOSE-CAPILLARY: 131 mg/dL — AB (ref 65–99)
GLUCOSE-CAPILLARY: 123 mg/dL — AB (ref 65–99)
Glucose-Capillary: 111 mg/dL — ABNORMAL HIGH (ref 65–99)
Glucose-Capillary: 113 mg/dL — ABNORMAL HIGH (ref 65–99)
Glucose-Capillary: 119 mg/dL — ABNORMAL HIGH (ref 65–99)
Glucose-Capillary: 137 mg/dL — ABNORMAL HIGH (ref 65–99)

## 2016-12-31 LAB — RENAL FUNCTION PANEL
Albumin: 2.2 g/dL — ABNORMAL LOW (ref 3.5–5.0)
Anion gap: 13 (ref 5–15)
BUN: 51 mg/dL — AB (ref 6–20)
CALCIUM: 7.1 mg/dL — AB (ref 8.9–10.3)
CO2: 20 mmol/L — ABNORMAL LOW (ref 22–32)
Chloride: 101 mmol/L (ref 101–111)
Creatinine, Ser: 3.34 mg/dL — ABNORMAL HIGH (ref 0.44–1.00)
GFR calc non Af Amer: 13 mL/min — ABNORMAL LOW (ref >60)
GFR, EST AFRICAN AMERICAN: 15 mL/min — AB (ref >60)
GLUCOSE: 121 mg/dL — AB (ref 65–99)
PHOSPHORUS: 5.3 mg/dL — AB (ref 2.5–4.6)
Potassium: 4.8 mmol/L (ref 3.5–5.1)
Sodium: 134 mmol/L — ABNORMAL LOW (ref 135–145)

## 2016-12-31 LAB — POCT ACTIVATED CLOTTING TIME
ACTIVATED CLOTTING TIME: 125 s
ACTIVATED CLOTTING TIME: 131 s
ACTIVATED CLOTTING TIME: 142 s
ACTIVATED CLOTTING TIME: 169 s
ACTIVATED CLOTTING TIME: 175 s
Activated Clotting Time: 109 seconds
Activated Clotting Time: 120 seconds
Activated Clotting Time: 136 seconds
Activated Clotting Time: 164 seconds
Activated Clotting Time: 169 seconds
Activated Clotting Time: 169 seconds
Activated Clotting Time: 169 seconds
Activated Clotting Time: 164 seconds

## 2016-12-31 LAB — BRAIN NATRIURETIC PEPTIDE: B Natriuretic Peptide: 321.4 pg/mL — ABNORMAL HIGH (ref 0.0–100.0)

## 2016-12-31 MED ORDER — HEPARIN BOLUS VIA INFUSION (CRRT)
1000.0000 [IU] | INTRAVENOUS | Status: DC | PRN
  Administered 2016-12-31 (×6): 1000 [IU] via INTRAVENOUS_CENTRAL
  Filled 2016-12-31: qty 1000

## 2016-12-31 MED ORDER — PRISMASOL BGK 4/2.5 32-4-2.5 MEQ/L IV SOLN
INTRAVENOUS | Status: DC
  Administered 2016-12-31 – 2017-01-01 (×3): via INTRAVENOUS_CENTRAL
  Filled 2016-12-31 (×4): qty 5000

## 2016-12-31 MED ORDER — SODIUM CHLORIDE 0.9 % IJ SOLN
250.0000 [IU]/h | INTRAMUSCULAR | Status: DC
  Administered 2016-12-31: 250 [IU]/h via INTRAVENOUS_CENTRAL
  Administered 2016-12-31: 1250 [IU]/h via INTRAVENOUS_CENTRAL
  Administered 2016-12-31: 1850 [IU]/h via INTRAVENOUS_CENTRAL
  Administered 2017-01-01 (×4): 2100 [IU]/h via INTRAVENOUS_CENTRAL
  Filled 2016-12-31 (×8): qty 2

## 2016-12-31 MED ORDER — PRISMASOL BGK 4/2.5 32-4-2.5 MEQ/L IV SOLN
INTRAVENOUS | Status: DC
  Administered 2016-12-31 – 2017-01-01 (×6): via INTRAVENOUS_CENTRAL
  Filled 2016-12-31 (×8): qty 5000

## 2016-12-31 MED ORDER — BISACODYL 10 MG RE SUPP
10.0000 mg | Freq: Every day | RECTAL | Status: DC | PRN
  Filled 2016-12-31: qty 1

## 2016-12-31 MED ORDER — HEPARIN SODIUM (PORCINE) 1000 UNIT/ML DIALYSIS
1000.0000 [IU] | INTRAMUSCULAR | Status: DC | PRN
  Filled 2016-12-31: qty 6

## 2016-12-31 MED ORDER — PRISMASOL BGK 4/2.5 32-4-2.5 MEQ/L IV SOLN
INTRAVENOUS | Status: DC
  Administered 2016-12-31 – 2017-01-01 (×2): via INTRAVENOUS_CENTRAL
  Filled 2016-12-31 (×2): qty 5000

## 2016-12-31 MED ORDER — SODIUM CHLORIDE 0.9 % IV SOLN: INTRAVENOUS | Status: DC | PRN

## 2016-12-31 NOTE — Progress Notes (Addendum)
..   Name: DEMECIA Woodward MRN: [id number] DOB: 04/01/1946    ADMISSION DATE:  12/28/2016 CONSULTATION DATE:  12/28/16  REFERRING MD :  Gabriel Cirri  CHIEF COMPLAINT:  No Urine output >24 hrs & SOB   BRIEF PATIENT DESCRIPTION: 71 yr old female with a PMHx significant for breast cancer, 25x17x19cm pelvic mass on CT s/p biopsy presents hypotensive and SOB with oliguria, hyperkalemia and acidosis.   SIGNIFICANT EVENTS  Rt porta cath in place>> 9/19 lt I j 3 port hd cath>>  STUDIES:  CT Abdomen/Pelvis as noted  SUBJECTIVE:  NGT placed for vomiting Vaso added Levo cut in half  VITAL SIGNS: Temp:  [97.4 F (36.3 C)-98.6 F (37 C)] 97.7 F (36.5 C) (09/23 0718) Pulse Rate:  [86-109] 90 (09/23 0715) Resp:  [15-35] 22 (09/23 0715) BP: (78-112)/(32-74) 97/59 (09/23 0715) SpO2:  [90 %-100 %] 97 % (09/23 0715) Arterial Line BP: (72-85)/(46-55) 78/52 (09/22 0915) Weight:  [110.7 kg (244 lb 0.8 oz)] 110.7 kg (244 lb 0.8 oz) (09/23 0500)  PHYSICAL EXAMINATION: General: awake, cooperative Neuro: no changes in neuro status, nonfocal, perrl, speaks full senstences HEENT: jvd increased PULM: coarse CV: s1 s2 RRR distant GI: distended, no change sin tender lower, no r/g Extremities: edema 3 plus, erythema left anterior improved      Recent Labs Lab 12/29/16 2135 12/30/16 0643 12/31/16 0601  NA 132* 133* 131*  K 5.8* 4.7 5.6*  CL 98* 98* 98*  CO2 19* 20* 19*  BUN 69* 44* 60*  CREATININE 3.86* 2.90* 3.82*  GLUCOSE 146* 153* 126*    Recent Labs Lab 12/29/16 2135 12/30/16 0643 12/31/16 0601  HGB 8.6* 8.8* 7.4*  HCT 26.8* 27.4* 22.8*  WBC 19.7* 20.6* 13.4*  PLT 322 279 184   Dg Abd 1 View  Result Date: 12/30/2016 CLINICAL DATA:  Gastric catheter placement EXAM: ABDOMEN - 1 VIEW COMPARISON:  Film from earlier in the same day FINDINGS: Gastric catheter is now noted coiled within the stomach in the left upper quadrant. Only a portion of the catheter is visualized on this film.  IMPRESSION: Gastric catheter coiled within the stomach. Electronically Signed   By: Inez Catalina M.D.   On: 12/30/2016 12:08   Dg Abd 1 View  Result Date: 12/30/2016 CLINICAL DATA:  Possible small-bowel obstruction EXAM: ABDOMEN - 1 VIEW COMPARISON:  12/22/2016 FINDINGS: Scattered large and small bowel gas is seen. Contrast is noted within the stomach. No distal contrast enhancement is seen. Diffuse increased density is noted emanating from the pelvis consistent with the patient's given clinical history of pelvic mass. IMPRESSION: Large pelvic mass stable from the recent CT. Contrast material within the distal stomach. Electronically Signed   By: Inez Catalina M.D.   On: 12/30/2016 09:21   Dg Chest Port 1 View  Result Date: 12/31/2016 CLINICAL DATA:  71 year old female with pelvic mass. Lower extremity cellulitis. Acute renal failure. EXAM: PORTABLE CHEST 1 VIEW COMPARISON:  12/30/2016 and earlier. FINDINGS: Portable AP semi upright view at 0518 hours. Stable right chest porta cath. Stable left IJ approach dual lumen catheter. Enteric tube has been placed and loops in the left upper quadrant. Stable lung volumes. Stable cardiac size and mediastinal contours. Increased retrocardiac opacity on the left. Stable ventilation elsewhere. No pneumothorax or pulmonary edema. Possible small pleural effusions. IMPRESSION: 1. Enteric tube placed, looped in the left upper quadrant. Otherwise stable lines and tubes. 2. Increased left lung base atelectasis or consolidation. Stable ventilation otherwise. Electronically Signed   By:  Genevie Ann M.D.   On: 12/31/2016 07:15   Dg Chest Port 1 View  Result Date: 12/30/2016 CLINICAL DATA:  Acute renal failure EXAM: PORTABLE CHEST 1 VIEW COMPARISON:  Chest x-rays dated 12/29/2016 and 12/26/2016. FINDINGS: Study is hypoinspiratory with crowding of the perihilar bronchovascular markings. Given the low lung volumes, lungs appear clear. Probable small left pleural effusion. No  pneumothorax seen. Heart size and mediastinal contours are stable. Right chest wall Port-A-Cath is stable in position with tip at the level of the mid/lower SVC. IMPRESSION: Low lung volumes. No evidence of pneumonia or pulmonary edema. Probable small left pleural effusion. Electronically Signed   By: Franki Cabot M.D.   On: 12/30/2016 07:14    ASSESSMENT / PLAN: 71 yr old female with Large pelvic mass presenting with oliguria and  hyperkalemia   Active Issues: 1. Acute Renal Failure: Hyperkalemia -> emergent HD 2. Partial Small bowel obstruction 3. 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 Shock 5. Sarcoma, spindle cell  NEURO: Hyponatremia -intake likely error, will attempt to correct with RN -consider cvvhd today  CARDIAC: Hypotensive / shock Hypovolemia on admission - resolving -noninvasive monitor technical issues,, -dc, rep called -CVP unable to have accurate reading/ waveform -levophed to map goals 60 -vasopressin keep until levo to 5 mics then dc -limited response to albumin, dc further  PULMONARY: At risk pulm edema, at risk aspiration on supplemental O2- goal Sat >92% -Dyspneic mild -pcxr no overt edema - no new infiltrates -pcxr in am   ID: Suspected sources: SBP vs GU and significant LE cellulitis noted Follow cultures- remains neg pcxr for asp risk -maintain zosyn  -pct tracks rise crt, no fevers  Endocrine: CBG (last 3)   Recent Labs  12/31/16 0048 12/31/16 0456 12/31/16 0721  GLUCAP 137* 119* 131*    Cortisol level wnl, dc roids BG checks with ISS  GI: Partial small bowel obstruction, subacute chronic from mass vomitiing 9/22 -keep NGT , int sucition -likley to clamp in am  -ppi  -correct lytes -abdo exam unchanged, daughter desribes only 500 cc para in past, this would not alter any resp mechanics to drain and HIGH risk   Heme: Normocytic anemia with anisocytosis Dilutional anemia  Recent Labs  12/30/16 0643 12/31/16 0601  HGB 8.8*  7.4*    Cbc in am Neg balance and follow hgb  Sub q hep No lovenox with renal failure  Thrombocytosis- most likely reactive in setting of malignancy - wnl sub q heparin  RENAL Lab Results  Component Value Date   CREATININE 3.82 (H) 12/31/2016   CREATININE 2.90 (H) 12/30/2016   CREATININE 3.86 (H) 12/29/2016    Recent Labs Lab 12/29/16 2135 12/30/16 0643 12/31/16 0601  K 5.8* 4.7 5.6*    Acute Renal Failure: ACS vs ATN vs postobstructive cosnider cvvhd Will d/w renal, K rise, low to no output  Ccm time 35 min    Will call [facility] again to get icu bed  Lavon Paganini. Titus Mould, MD, FACP Pgr: [facility] 12/31/2016 7:28 AM   D/w [facility], given acute renal failure and performance status, she is NO LONGER a candidate for chemo or surgery at [facility] I wil update daughter and pt

## 2016-12-31 NOTE — Progress Notes (Signed)
Pharmacy Antibiotic Note Ana Woodward is a 71 y.o. female admitted on 12/22/2016.  Transitioning to CRRT today. Continuing zosyn given pt is immunosuppressed, possible sources include SBP vs GU vs cellulitis. Today is day #4 of zosyn.  Plan: Zosyn 3.375 g IV q6h Monitor HD F/u cultures   Height: 5\' 8"  (172.7 cm) Weight: 244 lb 0.8 oz (110.7 kg) IBW/kg (Calculated) : 63.9  Temp (24hrs), Avg:97.9 F (36.6 C), Min:97.4 F (36.3 C), Max:98.6 F (37 C)   Recent Labs Lab 01/05/2017 2136 12/28/16 0018  12/28/16 0423  12/28/16 1336  12/28/16 1606 12/28/16 1732 12/28/16 1830 12/29/16 0418 12/29/16 2135 12/30/16 0643 12/31/16 0601  WBC  --   --   --   --   < >  --   --  16.6*  --   --  18.5* 19.7* 20.6* 13.4*  CREATININE  --   --   < >  --   < >  --   < >  --  2.04*  --  3.10* 3.86* 2.90* 3.82*  LATICACIDVEN 1.95* 3.22*  --  3.3*  --  2.0*  --   --   --  1.8  --   --   --   --   < > = values in this interval not displayed.  Estimated Creatinine Clearance: 18.1 mL/min (A) (by C-G formula based on SCr of 3.82 mg/dL (H)).    Allergies  Allergen Reactions  . Codeine Nausea And Vomiting  . Vicodin [Hydrocodone-Acetaminophen] Nausea And Vomiting    Antimicrobials this admission: 9/20 zosyn >>  9/20 vancomycin >> 9/22  Dose adjustments this admission: N/A  Microbiology results:  BCx: ngtd  MRSA PCR: neg   Harvel Quale 12/31/2016 9:28 AM

## 2016-12-31 NOTE — Progress Notes (Signed)
eLink Physician-Brief Progress Note Patient Name: Ana Woodward DOB: Feb 07, 1946 MRN: [id number]   Date of Service  12/31/2016  HPI/Events of Note  Possible rectal impaction  eICU Interventions  rec digital exam/ disimpaction if needed and dulcolax sup prn     Intervention Category Minor Interventions: Routine modifications to care plan (e.g. PRN medications for pain, fever)  Christinia Gully 12/31/2016, 5:35 PM

## 2016-12-31 NOTE — Progress Notes (Signed)
Admit: 12/10/2016 LOS: 3  89F anuric AKI (nl BL GFR) and large pelvic/abd sarcoma; hypotension  Subjective: Remains on NE gtt K up to 5.6 Anuric Awake, alert Daughter at bedside updated  09/22 0701 - 09/23 0700 In: 13290.8 [I.V.:13100.8; NG/GT:90; IV Piggyback:100] Out: 2170 [Urine:20; Emesis/NG output:2150]  Filed Weights   12/29/16 0305 12/30/16 0315 12/31/16 0500  Weight: 108.7 kg (239 lb 10.2 oz) 110.5 kg (243 lb 9.7 oz) 110.7 kg (244 lb 0.8 oz)    Scheduled Meds: . heparin subcutaneous  5,000 Units Subcutaneous Q8H  . levothyroxine  56 mcg Intravenous Daily   Continuous Infusions: . sodium chloride 250 mL (12/31/16 0700)  . sodium chloride    . sodium chloride    . albumin human    . norepinephrine (LEVOPHED) Adult infusion 15 mcg/min (12/31/16 0700)  . piperacillin-tazobactam (ZOSYN)  IV 3.375 g (12/31/16 1700)  . vasopressin (PITRESSIN) infusion - *FOR SHOCK* 0.03 Units/min (12/31/16 0700)   PRN Meds:.sodium chloride, sodium chloride, sodium chloride, acetaminophen, alteplase, heparin, lidocaine (PF), lidocaine-prilocaine, ondansetron **OR** ondansetron (ZOFRAN) IV, pantoprazole (PROTONIX) IV, pentafluoroprop-tetrafluoroeth  Current Labs: reviewed    Physical Exam:  Blood pressure (!) 97/59, pulse 90, temperature 97.7 F (36.5 C), temperature source Oral, resp. rate (!) 22, height 5\' 8"  (1.727 m), weight 110.7 kg (244 lb 0.8 oz), SpO2 97 %. Cachectic, awake, smiling L temp HD cath in IJ Temporal wasting RRR Diminished BS throughout Firm, protuberant abdomen 3-4+ LEE Nonfocal  A 1. AKI likely hypovolemia, ? Obstruction but no HN and foley present on Abd CT; seen by CCS for abd compartment syndrome, no intervention needed 2. Hypotension on NE 3. Large aggressive abd malignancy, followed by [facility], CTX/debulking was offered there; plan for ? Transfer 4. pSBO 5. Ascites, malignant 6. Anemia, not on ESA; per CCM transfuse as needed  P 1. Pt has not tolerated  iHD 2/2 hypotension 2. Start CRRT today, all 4K, UFR 44mL/hr net neg max, titrated heparin 3. Will follow closely   Pearson Grippe MD 12/31/2016, 7:45 AM   Recent Labs Lab 12/28/16 1732 12/29/16 0418 12/29/16 2135 12/30/16 0643 12/31/16 0601  NA 132* 132* 132* 133* 131*  K 3.9 5.4* 5.8* 4.7 5.6*  CL 100* 100* 98* 98* 98*  CO2 24 20* 19* 20* 19*  GLUCOSE 91 98 146* 153* 126*  BUN 41* 59* 69* 44* 60*  CREATININE 2.04* 3.10* 3.86* 2.90* 3.82*  CALCIUM 8.0* 7.9* 7.7* 7.6* 7.0*  PHOS 3.8 6.4* 7.3*  --   --     Recent Labs Lab 12/22/2016 2049  12/29/16 2135 12/30/16 0643 12/31/16 0601  WBC 20.4*  < > 19.7* 20.6* 13.4*  NEUTROABS 18.3*  --   --   --  11.3*  HGB 8.9*  < > 8.6* 8.8* 7.4*  HCT 26.4*  < > 26.8* 27.4* 22.8*  MCV 85.4  < > 86.7 86.7 86.7  PLT 488*  < > 322 279 184  < > = values in this interval not displayed.

## 2016-12-31 NOTE — Progress Notes (Signed)
Pt with 100 cc bile out after NG tube clamped for 2 hours. Dr Melvyn Novas notified, order to remove NG tube given. Ng tube removed without difficulty, pt tolerated well.

## 2017-01-01 ENCOUNTER — Inpatient Hospital Stay (HOSPITAL_COMMUNITY): Payer: Medicare Other

## 2017-01-01 LAB — COMPREHENSIVE METABOLIC PANEL
ALK PHOS: 61 U/L (ref 38–126)
ALT: 15 U/L (ref 14–54)
AST: 38 U/L (ref 15–41)
Albumin: 2.3 g/dL — ABNORMAL LOW (ref 3.5–5.0)
Anion gap: 12 (ref 5–15)
BUN: 43 mg/dL — AB (ref 6–20)
CALCIUM: 7.3 mg/dL — AB (ref 8.9–10.3)
CHLORIDE: 100 mmol/L — AB (ref 101–111)
CO2: 22 mmol/L (ref 22–32)
CREATININE: 2.77 mg/dL — AB (ref 0.44–1.00)
GFR calc non Af Amer: 16 mL/min — ABNORMAL LOW (ref >60)
GFR, EST AFRICAN AMERICAN: 19 mL/min — AB (ref >60)
GLUCOSE: 106 mg/dL — AB (ref 65–99)
Potassium: 5.7 mmol/L — ABNORMAL HIGH (ref 3.5–5.1)
SODIUM: 134 mmol/L — AB (ref 135–145)
Total Bilirubin: 1.2 mg/dL (ref 0.3–1.2)
Total Protein: 5 g/dL — ABNORMAL LOW (ref 6.5–8.1)

## 2017-01-01 LAB — GLUCOSE, CAPILLARY
GLUCOSE-CAPILLARY: 105 mg/dL — AB (ref 65–99)
GLUCOSE-CAPILLARY: 116 mg/dL — AB (ref 65–99)
GLUCOSE-CAPILLARY: 136 mg/dL — AB (ref 65–99)
GLUCOSE-CAPILLARY: 152 mg/dL — AB (ref 65–99)
Glucose-Capillary: 109 mg/dL — ABNORMAL HIGH (ref 65–99)
Glucose-Capillary: 120 mg/dL — ABNORMAL HIGH (ref 65–99)
Glucose-Capillary: 99 mg/dL (ref 65–99)
Glucose-Capillary: 53 mg/dL — ABNORMAL LOW (ref 65–99)

## 2017-01-01 LAB — MAGNESIUM: Magnesium: 2.3 mg/dL (ref 1.7–2.4)

## 2017-01-01 LAB — RENAL FUNCTION PANEL
ALBUMIN: 2.2 g/dL — AB (ref 3.5–5.0)
Anion gap: 14 (ref 5–15)
BUN: 36 mg/dL — ABNORMAL HIGH (ref 6–20)
CALCIUM: 7.2 mg/dL — AB (ref 8.9–10.3)
CO2: 19 mmol/L — AB (ref 22–32)
CREATININE: 2.34 mg/dL — AB (ref 0.44–1.00)
Chloride: 100 mmol/L — ABNORMAL LOW (ref 101–111)
GFR, EST AFRICAN AMERICAN: 23 mL/min — AB (ref >60)
GFR, EST NON AFRICAN AMERICAN: 20 mL/min — AB (ref >60)
Glucose, Bld: 112 mg/dL — ABNORMAL HIGH (ref 65–99)
PHOSPHORUS: 5.1 mg/dL — AB (ref 2.5–4.6)
Potassium: 6.2 mmol/L — ABNORMAL HIGH (ref 3.5–5.1)
SODIUM: 133 mmol/L — AB (ref 135–145)

## 2017-01-01 LAB — CBC WITH DIFFERENTIAL/PLATELET
BASOS ABS: 0.1 10*3/uL (ref 0.0–0.1)
Basophils Relative: 0 %
EOS ABS: 0.1 10*3/uL (ref 0.0–0.7)
Eosinophils Relative: 0 %
HCT: 23.2 % — ABNORMAL LOW (ref 36.0–46.0)
HEMOGLOBIN: 7.5 g/dL — AB (ref 12.0–15.0)
LYMPHS ABS: 0.8 10*3/uL (ref 0.7–4.0)
LYMPHS PCT: 5 %
MCH: 28 pg (ref 26.0–34.0)
MCHC: 32.3 g/dL (ref 30.0–36.0)
MCV: 86.6 fL (ref 78.0–100.0)
Monocytes Absolute: 1.5 10*3/uL — ABNORMAL HIGH (ref 0.1–1.0)
Monocytes Relative: 8 %
NEUTROS PCT: 87 %
Neutro Abs: 16 10*3/uL — ABNORMAL HIGH (ref 1.7–7.7)
PLATELETS: 191 10*3/uL (ref 150–400)
RBC: 2.68 MIL/uL — AB (ref 3.87–5.11)
RDW: 18.7 % — ABNORMAL HIGH (ref 11.5–15.5)
WBC: 18.4 10*3/uL — AB (ref 4.0–10.5)

## 2017-01-01 LAB — POCT ACTIVATED CLOTTING TIME
ACTIVATED CLOTTING TIME: 180 s
ACTIVATED CLOTTING TIME: 180 s
ACTIVATED CLOTTING TIME: 197 s
ACTIVATED CLOTTING TIME: 191 s
ACTIVATED CLOTTING TIME: 208 s
Activated Clotting Time: 169 seconds
Activated Clotting Time: 180 seconds
Activated Clotting Time: 180 seconds
Activated Clotting Time: 186 seconds
Activated Clotting Time: 191 seconds

## 2017-01-01 LAB — APTT

## 2017-01-01 LAB — POTASSIUM: Potassium: 6 mmol/L — ABNORMAL HIGH (ref 3.5–5.1)

## 2017-01-01 LAB — BRAIN NATRIURETIC PEPTIDE: B NATRIURETIC PEPTIDE 5: 122.5 pg/mL — AB (ref 0.0–100.0)

## 2017-01-01 LAB — PHOSPHORUS: PHOSPHORUS: 4.3 mg/dL (ref 2.5–4.6)

## 2017-01-01 MED ORDER — FENTANYL CITRATE (PF) 100 MCG/2ML IJ SOLN
12.5000 ug | INTRAMUSCULAR | Status: DC | PRN
Start: 2017-01-01 — End: 2017-01-02
  Administered 2017-01-01 (×2): 12.5 ug via INTRAVENOUS
  Filled 2017-01-01: qty 2

## 2017-01-01 MED ORDER — PRISMASOL BGK 0/2.5 32-2.5 MEQ/L IV SOLN
INTRAVENOUS | Status: DC
  Administered 2017-01-01 (×2): via INTRAVENOUS_CENTRAL
  Filled 2017-01-01 (×4): qty 5000

## 2017-01-01 MED ORDER — PRISMASOL BGK 0/2.5 32-2.5 MEQ/L IV SOLN
INTRAVENOUS | Status: DC
  Administered 2017-01-01: 09:00:00 via INTRAVENOUS_CENTRAL
  Filled 2017-01-01 (×2): qty 5000

## 2017-01-01 MED ORDER — PRISMASOL BGK 0/2.5 32-2.5 MEQ/L IV SOLN
INTRAVENOUS | Status: DC
  Administered 2017-01-01: 17:00:00 via INTRAVENOUS_CENTRAL
  Filled 2017-01-01 (×2): qty 5000

## 2017-01-01 MED ORDER — DEXTROSE 10 % IV SOLN
INTRAVENOUS | Status: DC
  Administered 2017-01-01: 22:00:00 via INTRAVENOUS

## 2017-01-01 MED ORDER — DEXTROSE 50 % IV SOLN
1.0000 | INTRAVENOUS | Status: DC | PRN
  Administered 2017-01-01 (×2): 50 mL via INTRAVENOUS
  Filled 2017-01-01: qty 50

## 2017-01-01 MED ORDER — PANTOPRAZOLE SODIUM 40 MG IV SOLR
40.0000 mg | Freq: Two times a day (BID) | INTRAVENOUS | Status: DC
  Administered 2017-01-01 (×2): 40 mg via INTRAVENOUS
  Filled 2017-01-01 (×2): qty 40

## 2017-01-01 MED ORDER — ARTIFICIAL TEARS OPHTHALMIC OINT
TOPICAL_OINTMENT | OPHTHALMIC | Status: DC | PRN
  Filled 2017-01-01: qty 3.5

## 2017-01-01 MED ORDER — WHITE PETROLATUM GEL
Status: AC
  Administered 2017-01-01: 01:00:00
  Filled 2017-01-01: qty 1

## 2017-01-01 MED ORDER — BISACODYL 10 MG RE SUPP
10.0000 mg | Freq: Once | RECTAL | Status: AC
  Administered 2017-01-01: 10 mg via RECTAL
  Filled 2017-01-01: qty 1

## 2017-01-01 MED ORDER — PIPERACILLIN-TAZOBACTAM IN DEX 2-0.25 GM/50ML IV SOLN
2.2500 g | Freq: Four times a day (QID) | INTRAVENOUS | Status: DC
  Administered 2017-01-01 (×2): 2.25 g via INTRAVENOUS
  Filled 2017-01-01 (×5): qty 50

## 2017-01-01 MED ORDER — FENTANYL CITRATE (PF) 100 MCG/2ML IJ SOLN
INTRAMUSCULAR | Status: AC
  Filled 2017-01-01: qty 2

## 2017-01-02 LAB — POCT ACTIVATED CLOTTING TIME
ACTIVATED CLOTTING TIME: 235 s
ACTIVATED CLOTTING TIME: 246 s
Activated Clotting Time: 285 seconds

## 2017-01-02 LAB — CULTURE, BLOOD (ROUTINE X 2)
Culture: NO GROWTH
SPECIAL REQUESTS: ADEQUATE

## 2017-01-03 LAB — GLUCOSE, CAPILLARY: Glucose-Capillary: 72 mg/dL (ref 65–99)

## 2017-01-08 ENCOUNTER — Telehealth: Payer: Self-pay

## 2017-01-08 NOTE — Progress Notes (Signed)
Dr. Moshe Cipro notified of pt's K 6.2.  All fluids switched to 0K/2.5ca at this time.  Order received to recheck potassium at 2100.

## 2017-01-08 NOTE — Progress Notes (Signed)
Admit: 12/31/2016 LOS: 4  62F anuric AKI (nl BL GFR) and large pelvic/abd sarcoma; hypotension  Subjective: Remains on NE plus vaso gtt- norepi titrated down some K 5.7 on CRRT - it seems to be running well  Anuric Awake, alert- she wants to get up out of bed to have BM Daughter at bedside updated  09/23 0701 - 09/24 0700 In: 798.3 [P.O.:60; I.V.:676.1; IV Piggyback:62.2] Out: 3154 [Urine:34; Emesis/NG output:100]  Filed Weights   12/30/16 0315 12/31/16 0500 23-Jan-2017 0430  Weight: 110.5 kg (243 lb 9.7 oz) 110.7 kg (244 lb 0.8 oz) 109.7 kg (241 lb 13.5 oz)    Scheduled Meds: . heparin subcutaneous  5,000 Units Subcutaneous Q8H  . levothyroxine  56 mcg Intravenous Daily   Continuous Infusions: . sodium chloride 250 mL (12/31/16 0800)  . sodium chloride    . sodium chloride    . sodium chloride    . albumin human    . heparin 10,000 units/ 20 mL infusion syringe 2,100 Units/hr (2017-01-23 0619)  . norepinephrine (LEVOPHED) Adult infusion 7.5 mcg/min (01/23/2017 0700)  . piperacillin-tazobactam (ZOSYN)  IV 3.375 g (01/23/17 0604)  . dialysis replacement fluid (prismasate) 500 mL/hr at 12/31/16 2158  . dialysis replacement fluid (prismasate) 300 mL/hr at January 23, 2017 0443  . dialysate (PRISMASATE) 1,000 mL/hr at 2017-01-23 0310  . vasopressin (PITRESSIN) infusion - *FOR SHOCK* 0.03 Units/min (01-23-2017 0700)   PRN Meds:.sodium chloride, sodium chloride, sodium chloride, sodium chloride, acetaminophen, alteplase, bisacodyl, heparin, heparin, heparin, lidocaine (PF), lidocaine-prilocaine, ondansetron **OR** ondansetron (ZOFRAN) IV, pantoprazole (PROTONIX) IV, pentafluoroprop-tetrafluoroeth  Current Labs: reviewed    Physical Exam:  Blood pressure (!) 88/55, pulse 91, temperature (!) 97.4 F (36.3 C), temperature source Oral, resp. rate (!) 32, height 5\' 8"  (1.727 m), weight 109.7 kg (241 lb 13.5 oz), SpO2 100 %. Cachectic, awake, smiling L temp HD cath in IJ Temporal  wasting RRR Diminished BS throughout Firm, protuberant abdomen 3-4+ LEE Nonfocal  A 1. AKI likely hypovolemia/ATN from hypotension, ? Obstruction but no HN and foley present on Abd CT; seen by CCS for abd compartment syndrome, no intervention needed 2. Hypotension on NE plus vaso 3. Large aggressive abd malignancy, followed by [facility], CTX/debulking was offered there; plan for ? Transfer- Dr. Titus Mould spoke with them , no longer a candidate for aggressive care- not sure what end point is 4. pSBO- wants to have BM- difficult to do on  CRRT  5. Ascites, malignant 6. Anemia, not on ESA due to malignancy; per CCM transfuse as needed  P 1. Pt has not tolerated iHD 2/2 hypotension 2. Now on CRRT Will follow closely.  I have changed her post filter fluids to zero K otherwise no change   Ashonte Angelucci A  January 23, 2017, 7:11 AM   Recent Labs Lab 12/29/16 2135  12/31/16 0601 12/31/16 1600 01-23-2017 0359  NA 132*  < > 131* 134* 134*  K 5.8*  < > 5.6* 4.8 5.7*  CL 98*  < > 98* 101 100*  CO2 19*  < > 19* 20* 22  GLUCOSE 146*  < > 126* 121* 106*  BUN 69*  < > 60* 51* 43*  CREATININE 3.86*  < > 3.82* 3.34* 2.77*  CALCIUM 7.7*  < > 7.0* 7.1* 7.3*  PHOS 7.3*  --   --  5.3* 4.3  < > = values in this interval not displayed.  Recent Labs Lab 12/12/2016 2049  12/30/16 0643 12/31/16 0601 01/23/17 0359  WBC 20.4*  < > 20.6* 13.4* 18.4*  NEUTROABS 18.3*  --   --  11.3* 16.0*  HGB 8.9*  < > 8.8* 7.4* 7.5*  HCT 26.4*  < > 27.4* 22.8* 23.2*  MCV 85.4  < > 86.7 86.7 86.6  PLT 488*  < > 279 184 191  < > = values in this interval not displayed.

## 2017-01-08 NOTE — Telephone Encounter (Signed)
On 01/08/2017 I received a d/c from [facility] (original). The d/c is for burial. The patient is a patient of Doctor Titus Mould. The d/c will be taken to [facility] ([phone]) tomorrow am for signature.

## 2017-01-08 NOTE — Progress Notes (Addendum)
..   Name: Ana Woodward MRN: [id number] DOB: [birth year]    ADMISSION DATE:  12/21/2016 CONSULTATION DATE:  12/28/16  REFERRING MD :  Gabriel Cirri  CHIEF COMPLAINT:  No Urine output >24 hrs & SOB   BRIEF PATIENT DESCRIPTION: 71 yr old female with a PMHx significant for breast cancer, 25x17x19cm pelvic mass on CT s/p biopsy presents hypotensive and SOB with oliguria, hyperkalemia and acidosis.   SIGNIFICANT EVENTS  Rt porta cath in place>> 9/19 lt I j 3 port hd cath>>  STUDIES:  CT Abdomen/Pelvis as noted  SUBJECTIVE:  Levo down to 7 cvvhd neg 50  NGt removed, diet clears started [facility] now saying no longer candidate for chemo or surgery  VITAL SIGNS: Temp:  [97.4 F (36.3 C)-98 F (36.7 C)] 97.4 F (36.3 C) (09/24 0040) Pulse Rate:  [30-96] 90 (09/24 0730) Resp:  [15-37] 30 (09/24 0730) BP: (66-160)/(46-119) 90/60 (09/24 0730) SpO2:  [93 %-100 %] 100 % (09/24 0730) Weight:  [109.7 kg (241 lb 13.5 oz)] 109.7 kg (241 lb 13.5 oz) (09/24 0430)  PHYSICAL EXAMINATION: General: more awake, no distress Neuro: nonfocal, alert HEENT: jvd slight down but remains PULM: CTA reduced bases CV: s1 s2 RRR no  GI: distended, BS present, no r/g Extremities: extensive edema 3 plus, erythema lower daily      Recent Labs Lab 12/31/16 0601 12/31/16 1600 01/17/2017 0359  NA 131* 134* 134*  K 5.6* 4.8 5.7*  CL 98* 101 100*  CO2 19* 20* 22  BUN 60* 51* 43*  CREATININE 3.82* 3.34* 2.77*  GLUCOSE 126* 121* 106*    Recent Labs Lab 12/30/16 0643 12/31/16 0601 2017/01/17 0359  HGB 8.8* 7.4* 7.5*  HCT 27.4* 22.8* 23.2*  WBC 20.6* 13.4* 18.4*  PLT 279 184 191   Dg Abd 1 View  Result Date: 12/30/2016 CLINICAL DATA:  Gastric catheter placement EXAM: ABDOMEN - 1 VIEW COMPARISON:  Film from earlier in the same day FINDINGS: Gastric catheter is now noted coiled within the stomach in the left upper quadrant. Only a portion of the catheter is visualized on this film. IMPRESSION: Gastric  catheter coiled within the stomach. Electronically Signed   By: Inez Catalina M.D.   On: 12/30/2016 12:08   Dg Abd 1 View  Result Date: 12/30/2016 CLINICAL DATA:  Possible small-bowel obstruction EXAM: ABDOMEN - 1 VIEW COMPARISON:  12/25/2016 FINDINGS: Scattered large and small bowel gas is seen. Contrast is noted within the stomach. No distal contrast enhancement is seen. Diffuse increased density is noted emanating from the pelvis consistent with the patient's given clinical history of pelvic mass. IMPRESSION: Large pelvic mass stable from the recent CT. Contrast material within the distal stomach. Electronically Signed   By: Inez Catalina M.D.   On: 12/30/2016 09:21   Dg Chest Port 1 View  Result Date: 2017/01/17 CLINICAL DATA:  Acute renal arterial thrombosis EXAM: PORTABLE CHEST 1 VIEW COMPARISON:  12/31/2016 FINDINGS: Right-sided Port-A-Cath and left-sided dialysis catheter are unchanged in position. Removal of nasogastric tube. Midline trachea. Normal heart size. No pleural effusion or pneumothorax. Improved left base aeration. Patchy airspace disease remaining. Clear right lung. Mild low low lung volumes with mild right hemidiaphragm elevation. IMPRESSION: Improved left base atelectasis or pneumonia. Removal of nasogastric tube. Electronically Signed   By: Abigail Miyamoto M.D.   On: 2017/01/17 06:36   Dg Chest Port 1 View  Result Date: 12/31/2016 CLINICAL DATA:  71 year old female with pelvic mass. Lower extremity cellulitis. Acute renal failure. EXAM: PORTABLE  CHEST 1 VIEW COMPARISON:  12/30/2016 and earlier. FINDINGS: Portable AP semi upright view at 0518 hours. Stable right chest porta cath. Stable left IJ approach dual lumen catheter. Enteric tube has been placed and loops in the left upper quadrant. Stable lung volumes. Stable cardiac size and mediastinal contours. Increased retrocardiac opacity on the left. Stable ventilation elsewhere. No pneumothorax or pulmonary edema. Possible small pleural  effusions. IMPRESSION: 1. Enteric tube placed, looped in the left upper quadrant. Otherwise stable lines and tubes. 2. Increased left lung base atelectasis or consolidation. Stable ventilation otherwise. Electronically Signed   By: Genevie Ann M.D.   On: 12/31/2016 07:15    ASSESSMENT / PLAN: 72 yr old female with Large pelvic mass presenting with oliguria and  hyperkalemia   Active Issues: 1. Acute Renal Failure: Hyperkalemia -> emergent HD 2. Partial Small bowel obstruction 3. 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 Shock 5. Sarcoma, spindle cell  NEURO: Pain  None present  CARDIAC: Hypotensive / shock Hypovolemia on admission - resolving -levophed is improved, to goal off is able -dc vaso if levo to 5 -no cvp available  PULMONARY: At risk pulm edema, at risk aspiration on supplemental O2- goal Sat >92% -maintain neg balance, we were successful 864 cc -will discuss wishes intubation today if worsen, she has gotten better with her resp satus -no asp noted  ID: Suspected sources: at risk SBP, low suspciion -add stop date zosyn -high risk tap, low volume last done as output, no symptoms to support SBP  Endocrine: CBG (last 3)   Recent Labs  12/31/16 1921 January 21, 2017 0038 01/21/2017 0441  GLUCAP 113* 105* 109*    BG checks with ISS  GI: Partial small bowel obstruction, subacute chronic from mass vomitiing 9/22 resolved -ngt out, clears--> to fulls Add ppi bid iv   Heme: Normocytic anemia with anisocytosis Dilutional anemia  Recent Labs  12/31/16 0601 01/21/17 0359  HGB 7.4* 7.5*    Sub q heo Improving hcy with neg baalnce  Thrombocytosis- most likely reactive in setting of malignancy - wnl sub q heparin  RENAL Lab Results  Component Value Date   CREATININE 2.77 (H) 01/21/2017   CREATININE 3.34 (H) 12/31/2016   CREATININE 3.82 (H) 12/31/2016    Recent Labs Lab 12/31/16 0601 12/31/16 1600 21-Jan-2017 0359  K 5.6* 4.8 5.7*    Acute Renal Failure: ACS  vs ATN vs postobstructive Cvvhd, doing well with 68  Hr, with preload dep , would  Not increase significantly   Will discuss code status  Ccm time 30 min   Lavon Paganini. Titus Mould, MD, FACP Pgr: [facility]  I have had extensive discussions with family pt and daughter. We discussed patients current circumstances and organ failures. We also discussed patient's prior wishes under circumstances such as this. Patient  has decided to NOT perform resuscitation if arrest but to continue current medical support for now.pressors okay. Continued medical care Comfort if fails

## 2017-01-08 NOTE — Progress Notes (Signed)
Pt's condition continues to worsen throughout the evening.  BP remains low despite nearing max on levophed drip and withholding fluid removal on CRRT. Mental status slowly declining and pt is becoming more lethargic.  Dr. Nelda Marseille at [facility] notified.  Pt's daughter is at bedside providing emotional support.

## 2017-01-08 NOTE — Progress Notes (Signed)
01-08-2017 11:47 PM  Patent maxed on pressors and on CRRT. Daughter at bedside providing support and has been kept informed. Patient steadily declined since 1400 (see previous notes). Patient time of death 07/14/2322. Pupils fixed, Asystole on the monitor, no heart or lung tones auscultated. Daughter made aware. Patient death confirmed with Cain Saupe, RN. Elink MD made aware. Chaplin at bedside.CDS made aware- Referral number [phone]. Comfort provided to family.    Mick Sell RN

## 2017-01-08 DEATH — deceased

## 2017-01-11 NOTE — Discharge Summary (Signed)
71 yr old female with a PMHx significant for breast cancer, 25x17x19cm pelvic mass on CT s/p biopsy presents hypotensive and SOB with oliguria, hyperkalemia and acidosis.   SIGNIFICANT EVENTS  Rt porta cath in place>> 9/19 lt I j 3 port hd cath>>  STUDIES:  CT Abdomen/Pelvis as noted  SUBJECTIVE:  Levo down to 7 cvvhd neg 50  NGt removed, diet clears started [facility] now saying no longer candidate for chemo or surgery DNR established per pt wishes She declined in evening and expired  Final diagnosis  1. Sarcoma abdominal massive 2. Acute renal failure 3. Shock 4. Acute pulmonary edema  Lavon Paganini. Titus Mould, MD, [place] Pgr: [facility]

## 2017-01-12 NOTE — Telephone Encounter (Signed)
01/12/17 received d/c from Dr. Salomon Fick [facility] to pick up and faxed them a copy. PWR

## 2018-05-12 IMAGING — DX DG ABDOMEN 1V
1 series · 1 of 1 positions shown · non-contrast
Comparison: Film from earlier in the same day

CLINICAL DATA: Gastric catheter placement

EXAM:
ABDOMEN - 1 VIEW

[abdomen kub]
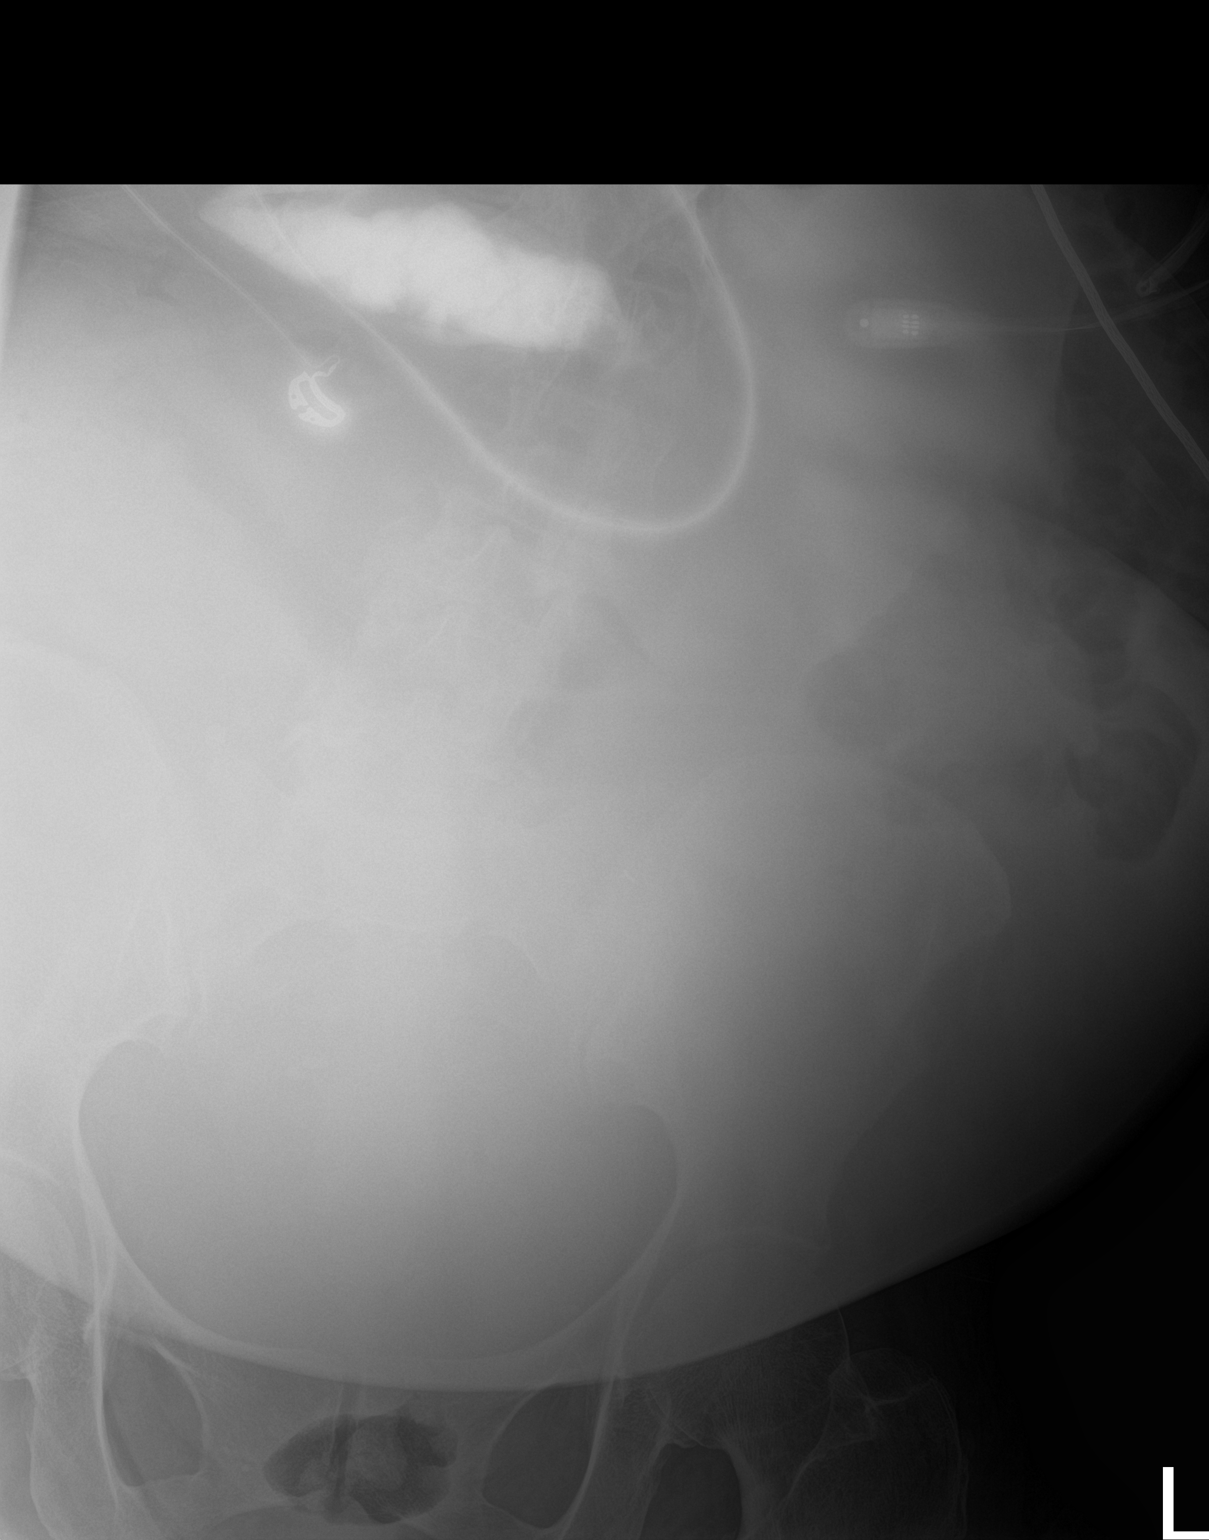

[1 of 1 positions shown; findings below may reference images not displayed]

FINDINGS: Gastric catheter is now noted coiled within the stomach in the left
upper quadrant. Only a portion of the catheter is visualized on this
film.
IMPRESSION: Gastric catheter coiled within the stomach.
